# Patient Record
Sex: Female | Born: 1945 | Race: White | Hispanic: No | Smoking: Never smoker
Health system: Southern US, Community
[De-identification: ages and names within clinical notes are randomized; demographics above are authoritative.]

## PROBLEM LIST (undated history)

## (undated) DIAGNOSIS — C50911 Malignant neoplasm of unspecified site of right female breast: Secondary | ICD-10-CM

## (undated) DIAGNOSIS — J302 Other seasonal allergic rhinitis: Secondary | ICD-10-CM

## (undated) DIAGNOSIS — Z98811 Dental restoration status: Secondary | ICD-10-CM

## (undated) DIAGNOSIS — R011 Cardiac murmur, unspecified: Secondary | ICD-10-CM

## (undated) DIAGNOSIS — R079 Chest pain, unspecified: Secondary | ICD-10-CM

## (undated) DIAGNOSIS — I340 Nonrheumatic mitral (valve) insufficiency: Secondary | ICD-10-CM

## (undated) DIAGNOSIS — E785 Hyperlipidemia, unspecified: Secondary | ICD-10-CM

## (undated) DIAGNOSIS — M1990 Unspecified osteoarthritis, unspecified site: Secondary | ICD-10-CM

## (undated) DIAGNOSIS — Z800 Family history of malignant neoplasm of digestive organs: Secondary | ICD-10-CM

## (undated) DIAGNOSIS — I313 Pericardial effusion (noninflammatory): Secondary | ICD-10-CM

## (undated) DIAGNOSIS — M19049 Primary osteoarthritis, unspecified hand: Secondary | ICD-10-CM

## (undated) DIAGNOSIS — G4700 Insomnia, unspecified: Secondary | ICD-10-CM

## (undated) DIAGNOSIS — E2839 Other primary ovarian failure: Secondary | ICD-10-CM

## (undated) DIAGNOSIS — R2231 Localized swelling, mass and lump, right upper limb: Secondary | ICD-10-CM

## (undated) DIAGNOSIS — Z972 Presence of dental prosthetic device (complete) (partial): Secondary | ICD-10-CM

## (undated) DIAGNOSIS — Z86718 Personal history of other venous thrombosis and embolism: Secondary | ICD-10-CM

## (undated) DIAGNOSIS — Z87448 Personal history of other diseases of urinary system: Secondary | ICD-10-CM

## (undated) DIAGNOSIS — E119 Type 2 diabetes mellitus without complications: Secondary | ICD-10-CM

## (undated) DIAGNOSIS — I10 Essential (primary) hypertension: Secondary | ICD-10-CM

## (undated) DIAGNOSIS — E7800 Pure hypercholesterolemia, unspecified: Secondary | ICD-10-CM

## (undated) DIAGNOSIS — I517 Cardiomegaly: Secondary | ICD-10-CM

## (undated) DIAGNOSIS — N200 Calculus of kidney: Secondary | ICD-10-CM

## (undated) HISTORY — DX: Hypercalcemia: E83.52

## (undated) HISTORY — DX: Cardiac murmur, unspecified: R01.1

## (undated) HISTORY — DX: Chest pain, unspecified: R07.9

## (undated) HISTORY — DX: Family history of malignant neoplasm of digestive organs: Z80.0

## (undated) HISTORY — DX: Malignant neoplasm of unspecified site of right female breast: C50.911

## (undated) HISTORY — DX: Other primary ovarian failure: E28.39

## (undated) HISTORY — DX: Nonrheumatic mitral (valve) insufficiency: I34.0

## (undated) HISTORY — DX: Primary osteoarthritis, unspecified hand: M19.049

## (undated) HISTORY — DX: Calculus of kidney: N20.0

## (undated) HISTORY — DX: Insomnia, unspecified: G47.00

## (undated) HISTORY — DX: Type 2 diabetes mellitus without complications: E11.9

## (undated) HISTORY — DX: Cardiomegaly: I51.7

## (undated) HISTORY — DX: Hyperlipidemia, unspecified: E78.5

---

## 1990-11-08 DIAGNOSIS — Z86718 Personal history of other venous thrombosis and embolism: Secondary | ICD-10-CM

## 1990-11-08 HISTORY — PX: ABDOMINAL HYSTERECTOMY: SHX81

## 1990-11-08 HISTORY — DX: Personal history of other venous thrombosis and embolism: Z86.718

## 2000-08-09 ENCOUNTER — Encounter: Payer: Self-pay | Admitting: Hematology and Oncology

## 2000-08-09 ENCOUNTER — Encounter: Admission: RE | Admit: 2000-08-09 | Discharge: 2000-08-09 | Payer: Self-pay | Admitting: Hematology and Oncology

## 2000-11-08 DIAGNOSIS — I3139 Other pericardial effusion (noninflammatory): Secondary | ICD-10-CM

## 2000-11-08 DIAGNOSIS — I313 Pericardial effusion (noninflammatory): Secondary | ICD-10-CM

## 2000-11-08 HISTORY — DX: Pericardial effusion (noninflammatory): I31.3

## 2000-11-08 HISTORY — DX: Other pericardial effusion (noninflammatory): I31.39

## 2000-12-12 ENCOUNTER — Encounter: Admission: RE | Admit: 2000-12-12 | Discharge: 2000-12-12 | Payer: Self-pay | Admitting: Hematology and Oncology

## 2000-12-12 ENCOUNTER — Encounter: Payer: Self-pay | Admitting: Hematology and Oncology

## 2001-02-13 ENCOUNTER — Encounter: Admission: RE | Admit: 2001-02-13 | Discharge: 2001-02-13 | Payer: Self-pay | Admitting: Hematology and Oncology

## 2001-02-13 ENCOUNTER — Encounter: Payer: Self-pay | Admitting: Hematology and Oncology

## 2001-06-16 ENCOUNTER — Ambulatory Visit: Admission: RE | Admit: 2001-06-16 | Discharge: 2001-06-16 | Payer: Self-pay | Admitting: Hematology and Oncology

## 2001-08-15 ENCOUNTER — Encounter: Payer: Self-pay | Admitting: Hematology and Oncology

## 2001-08-15 ENCOUNTER — Encounter: Admission: RE | Admit: 2001-08-15 | Discharge: 2001-08-15 | Payer: Self-pay | Admitting: Hematology and Oncology

## 2001-08-21 ENCOUNTER — Encounter: Payer: Self-pay | Admitting: Thoracic Surgery (Cardiothoracic Vascular Surgery)

## 2001-08-21 ENCOUNTER — Encounter
Admission: RE | Admit: 2001-08-21 | Discharge: 2001-08-21 | Payer: Self-pay | Admitting: Thoracic Surgery (Cardiothoracic Vascular Surgery)

## 2001-08-23 ENCOUNTER — Ambulatory Visit (HOSPITAL_COMMUNITY)
Admission: RE | Admit: 2001-08-23 | Discharge: 2001-08-23 | Payer: Self-pay | Admitting: Thoracic Surgery (Cardiothoracic Vascular Surgery)

## 2001-08-23 HISTORY — PX: TRANSESOPHAGEAL ECHOCARDIOGRAM: SHX273

## 2002-08-17 ENCOUNTER — Encounter: Payer: Self-pay | Admitting: Hematology and Oncology

## 2002-08-17 ENCOUNTER — Encounter: Admission: RE | Admit: 2002-08-17 | Discharge: 2002-08-17 | Payer: Self-pay | Admitting: Hematology and Oncology

## 2002-08-20 ENCOUNTER — Ambulatory Visit (HOSPITAL_COMMUNITY): Admission: RE | Admit: 2002-08-20 | Discharge: 2002-08-20 | Payer: Self-pay | Admitting: Gastroenterology

## 2002-12-29 ENCOUNTER — Encounter: Payer: Self-pay | Admitting: Emergency Medicine

## 2002-12-29 ENCOUNTER — Inpatient Hospital Stay (HOSPITAL_COMMUNITY): Admission: EM | Admit: 2002-12-29 | Discharge: 2003-01-01 | Payer: Self-pay | Admitting: Emergency Medicine

## 2003-08-20 ENCOUNTER — Encounter: Admission: RE | Admit: 2003-08-20 | Discharge: 2003-08-20 | Payer: Self-pay | Admitting: Internal Medicine

## 2003-08-20 ENCOUNTER — Encounter: Payer: Self-pay | Admitting: Internal Medicine

## 2004-03-09 ENCOUNTER — Encounter: Admission: RE | Admit: 2004-03-09 | Discharge: 2004-03-09 | Payer: Self-pay | Admitting: Internal Medicine

## 2004-03-11 ENCOUNTER — Ambulatory Visit (HOSPITAL_COMMUNITY): Admission: RE | Admit: 2004-03-11 | Discharge: 2004-03-11 | Payer: Self-pay | Admitting: Internal Medicine

## 2004-03-13 ENCOUNTER — Encounter: Admission: RE | Admit: 2004-03-13 | Discharge: 2004-03-13 | Payer: Self-pay | Admitting: Internal Medicine

## 2004-03-13 ENCOUNTER — Encounter (INDEPENDENT_AMBULATORY_CARE_PROVIDER_SITE_OTHER): Payer: Self-pay | Admitting: *Deleted

## 2004-03-19 ENCOUNTER — Encounter: Admission: RE | Admit: 2004-03-19 | Discharge: 2004-03-19 | Payer: Self-pay | Admitting: Surgery

## 2004-03-23 ENCOUNTER — Ambulatory Visit (HOSPITAL_COMMUNITY): Admission: RE | Admit: 2004-03-23 | Discharge: 2004-03-23 | Payer: Self-pay | Admitting: Surgery

## 2004-03-23 ENCOUNTER — Encounter (INDEPENDENT_AMBULATORY_CARE_PROVIDER_SITE_OTHER): Payer: Self-pay | Admitting: *Deleted

## 2004-03-23 ENCOUNTER — Ambulatory Visit (HOSPITAL_BASED_OUTPATIENT_CLINIC_OR_DEPARTMENT_OTHER): Admission: RE | Admit: 2004-03-23 | Discharge: 2004-03-23 | Payer: Self-pay | Admitting: Surgery

## 2004-03-23 ENCOUNTER — Encounter: Admission: RE | Admit: 2004-03-23 | Discharge: 2004-03-23 | Payer: Self-pay | Admitting: Surgery

## 2004-04-03 ENCOUNTER — Ambulatory Visit (HOSPITAL_COMMUNITY): Admission: RE | Admit: 2004-04-03 | Discharge: 2004-04-04 | Payer: Self-pay | Admitting: Surgery

## 2004-04-03 ENCOUNTER — Encounter (INDEPENDENT_AMBULATORY_CARE_PROVIDER_SITE_OTHER): Payer: Self-pay | Admitting: *Deleted

## 2004-04-03 HISTORY — PX: MASTECTOMY: SHX3

## 2004-11-08 HISTORY — PX: FOOT NEUROMA SURGERY: SHX646

## 2004-12-17 ENCOUNTER — Ambulatory Visit: Payer: Self-pay | Admitting: Oncology

## 2004-12-22 ENCOUNTER — Encounter: Admission: RE | Admit: 2004-12-22 | Discharge: 2004-12-22 | Payer: Self-pay | Admitting: Oncology

## 2005-06-21 IMAGING — CR DG CHEST 2V
2 series · 2 of 2 positions shown · non-contrast
Comparison: none

CLINICAL DATA: Pre-op respiratory exam prior to surgery for left breast carcinoma. 
 CHEST 
 Two views of the chest are compared to a chest x-ray of 08/21/01.  The lungs are clear.  The heart is mildly enlarged and stable.  No bony abnormality is seen. 
 IMPRESSION
 Stable mild cardiomegaly.  No active lung disease.

[view not recorded (1 of 2)]
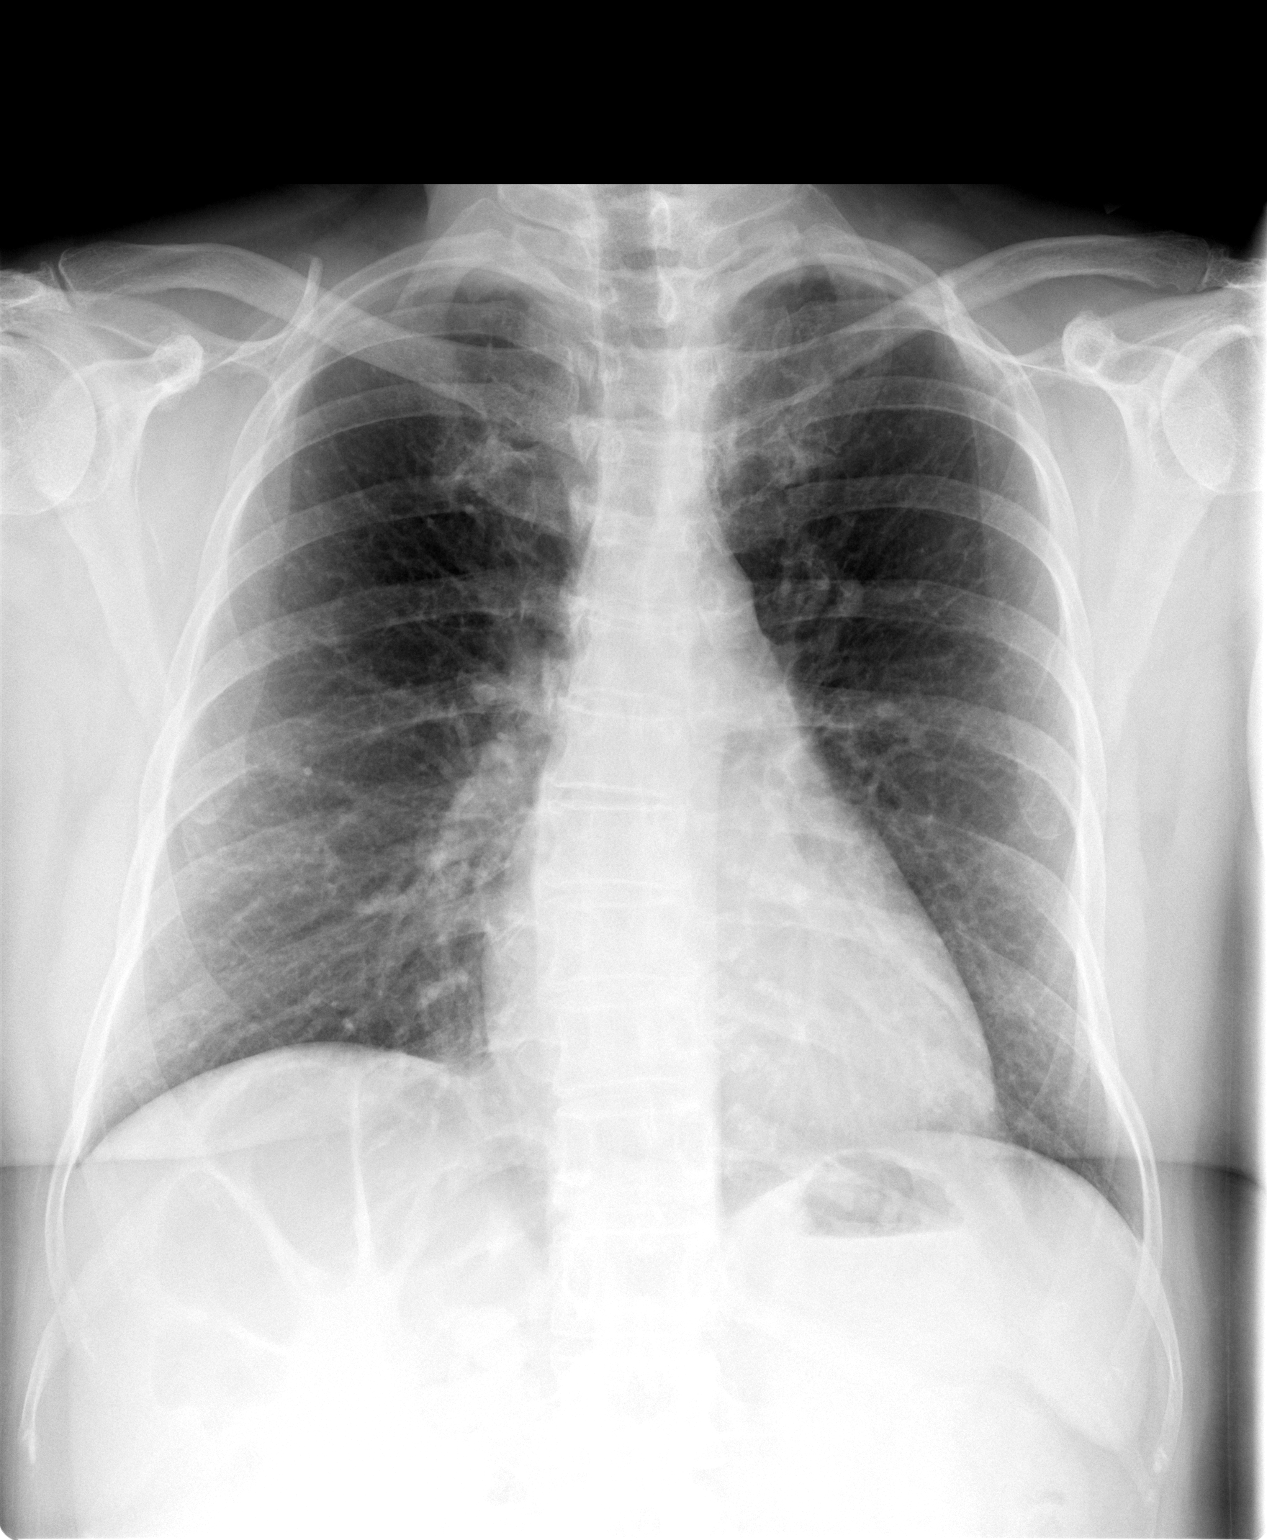

[view not recorded (2 of 2)]
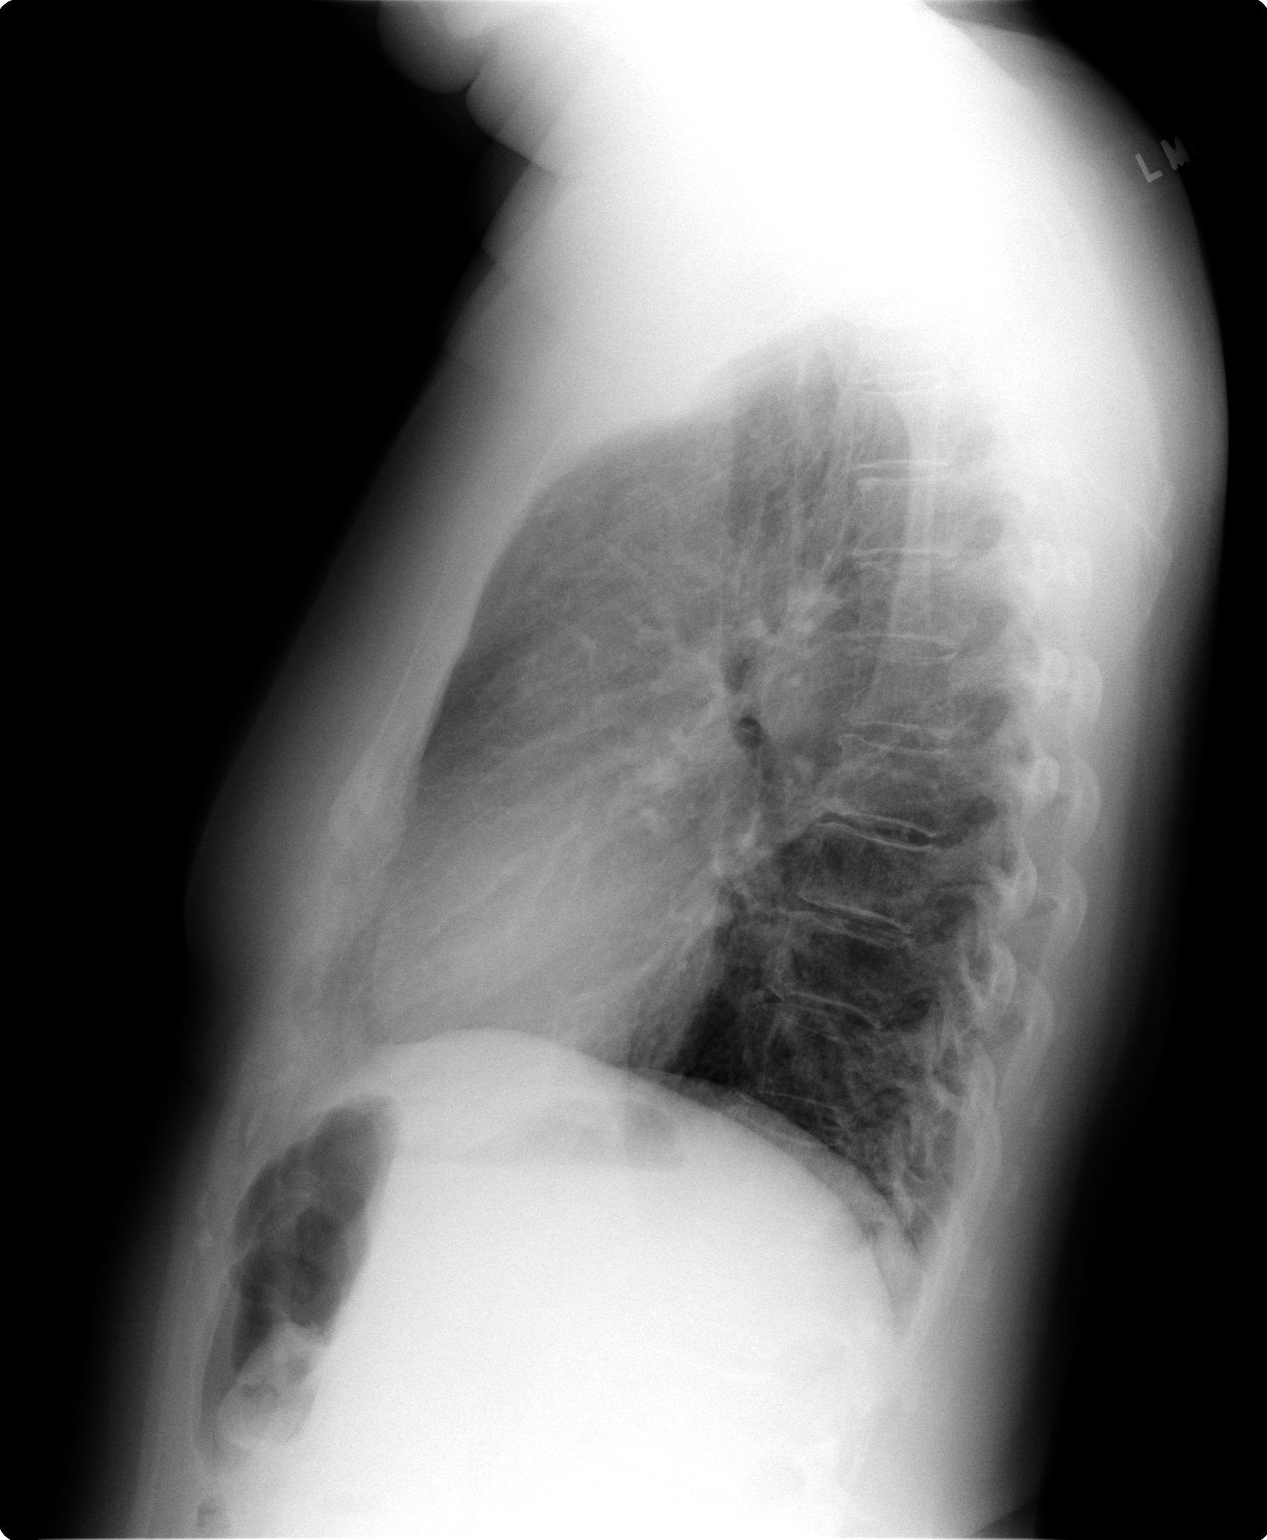

[2 of 2 positions shown; findings below may reference images not displayed]

## 2005-12-23 ENCOUNTER — Encounter: Admission: RE | Admit: 2005-12-23 | Discharge: 2005-12-23 | Payer: Self-pay | Admitting: Internal Medicine

## 2006-04-15 ENCOUNTER — Ambulatory Visit: Payer: Self-pay | Admitting: Oncology

## 2006-04-26 ENCOUNTER — Encounter: Admission: RE | Admit: 2006-04-26 | Discharge: 2006-04-26 | Payer: Self-pay | Admitting: Oncology

## 2006-05-03 ENCOUNTER — Encounter: Admission: RE | Admit: 2006-05-03 | Discharge: 2006-05-03 | Payer: Self-pay | Admitting: Oncology

## 2006-05-04 ENCOUNTER — Encounter: Admission: RE | Admit: 2006-05-04 | Discharge: 2006-05-04 | Payer: Self-pay | Admitting: Oncology

## 2006-05-20 ENCOUNTER — Ambulatory Visit (HOSPITAL_BASED_OUTPATIENT_CLINIC_OR_DEPARTMENT_OTHER): Admission: RE | Admit: 2006-05-20 | Discharge: 2006-05-20 | Payer: Self-pay | Admitting: Surgery

## 2006-05-20 ENCOUNTER — Encounter (INDEPENDENT_AMBULATORY_CARE_PROVIDER_SITE_OTHER): Payer: Self-pay | Admitting: Specialist

## 2006-06-23 ENCOUNTER — Ambulatory Visit: Payer: Self-pay | Admitting: Oncology

## 2006-09-22 ENCOUNTER — Encounter (INDEPENDENT_AMBULATORY_CARE_PROVIDER_SITE_OTHER): Payer: Self-pay | Admitting: *Deleted

## 2006-09-22 ENCOUNTER — Ambulatory Visit (HOSPITAL_BASED_OUTPATIENT_CLINIC_OR_DEPARTMENT_OTHER): Admission: RE | Admit: 2006-09-22 | Discharge: 2006-09-23 | Payer: Self-pay | Admitting: Surgery

## 2006-09-22 HISTORY — PX: TOTAL MASTECTOMY: SHX6129

## 2007-04-13 ENCOUNTER — Ambulatory Visit: Payer: Self-pay | Admitting: Oncology

## 2007-09-11 ENCOUNTER — Encounter: Admission: RE | Admit: 2007-09-11 | Discharge: 2007-09-25 | Payer: Self-pay | Admitting: Internal Medicine

## 2007-12-26 ENCOUNTER — Encounter: Admission: RE | Admit: 2007-12-26 | Discharge: 2008-01-09 | Payer: Self-pay | Admitting: Internal Medicine

## 2010-08-10 ENCOUNTER — Encounter: Admission: RE | Admit: 2010-08-10 | Discharge: 2010-08-10 | Payer: Self-pay | Admitting: Pediatrics

## 2010-11-29 ENCOUNTER — Encounter: Payer: Self-pay | Admitting: Surgery

## 2011-01-25 ENCOUNTER — Other Ambulatory Visit: Payer: Self-pay | Admitting: Family Medicine

## 2011-01-25 DIAGNOSIS — R591 Generalized enlarged lymph nodes: Secondary | ICD-10-CM

## 2011-01-26 ENCOUNTER — Ambulatory Visit
Admission: RE | Admit: 2011-01-26 | Discharge: 2011-01-26 | Disposition: A | Discharge status: Home / Self Care | Payer: Medicare Other | Source: Ambulatory Visit | Attending: Family Medicine | Admitting: Family Medicine

## 2011-01-26 DIAGNOSIS — R591 Generalized enlarged lymph nodes: Secondary | ICD-10-CM

## 2011-03-26 NOTE — Discharge Summary (Signed)
   NAME:  Lisa, Bradshaw                         ACCOUNT NO.:  [id number]   MEDICAL RECORD NO.:  [id number]                   PATIENT TYPE:  INP   LOCATION:  5705                                 FACILITY:  [facility]   PHYSICIAN:  Olene Craven, M.D.            DATE OF BIRTH:  [birth year]   DATE OF ADMISSION:  12/29/2002  DATE OF DISCHARGE:  01/01/2003                                 DISCHARGE SUMMARY   DISCHARGE DIAGNOSES:  1. Cleared abdominal pain.  2. Diverticulitis.  3. Hypertension.  4. Hyperlipidemia.   DISCHARGE MEDICATIONS:  1. Lipitor 40 mg q.h.s.  2. Liqui-flax seeds, 2 tablets p.o. daily.  3. Prinzide 20/12.5 p.o. b.i.d.  4. Cipro 500 mg p.o. b.i.d. x8 days.  5. Flagyl 70 mg p.o. q.i.d. x8 days.   FOLLOW UP:  The patient will follow up with Dr.  Barbee Shropshire in 2 weeks' time.   CONSULTATIONS:  None.   PROCEDURE:  None.   HISTORY OF PRESENT ILLNESS:  The patient was admitted on 12/29/02 after  experiencing nausea and vomiting and abdominal discomfort. She was noted to  have left lower quadrant discomfort.  CT scan of the pelvis indicated  diverticulitis. She was started on IV antibiotics.  She has had a slow  improvement of her white count and discomfort, she was able to tolerate full  diet by the time of discharge. Her white count at the date of admission was  17.9.  At the time of discharge, it had normalized to 8.4.   The patient was discharged in stable condition to finish out a full course  of antibiotics and follow up as an outpatient.   CONDITION ON DISCHARGE:  Stable.                                               Olene Craven, M.D.    DEH/MEDQ  D:  02/25/2003  T:  02/25/2003  Job:  [id number]

## 2011-03-26 NOTE — Op Note (Signed)
Lisa Bradshaw, CRAFTS NO.:  [id number]   MEDICAL RECORD NO.:  [id number]          PATIENT TYPE:  AMB   LOCATION:  DSC                          FACILITY:  [facility]   PHYSICIAN:  Currie Paris, M.D.DATE OF BIRTH:  [birth year]   DATE OF PROCEDURE:  09/22/2006  DATE OF DISCHARGE:                                 OPERATIVE REPORT   OFFICE MEDICAL RECORD NUMBER:  [id number].   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   OPERATION:  Right total mastectomy with blue dye injection and axillary  sentinel lymph node biopsy.   SURGEON:  Dr. Jamey Ripa.   ASSISTANT:  Dr. Lurene Shadow.   ANESTHESIA:  General.   CLINICAL HISTORY:  This is a 60-year lady who had a right breast biopsy on  July 13 which showed atypical hyperplasia.  She is status post left  mastectomy for breast cancer.  She has a history of DVT and is not a  candidate for anti-estrogen therapy.  She elected therefore to have a right  prophylactic mastectomy with sentinel node biopsy.   DESCRIPTION OF PROCEDURE:  The patient was seen in the holding area, and she  had no further questions.  The operative site was noted to be the right  side, but since she has already had a left mastectomy, no marks made.   The patient was then taken to the operating room, and after satisfactory  general anesthesia had been obtained, left breast was prepped and a time-out  occurred.   I injected 5 mL of dilute methylene blue circumareolarly and massaged it in.  The entire breast was then prepped with Betadine and a sterile field  established.   An elliptical incision was made.  Neoprobe identified a hot area in the  axilla, and the overlying skin was marked.   Skin flap was raised superiorly to the clavicle, medically to the sternum  and laterally into the axilla.  The axillary fat was entered, and a  three  little blue lymphatics were found leading to a single large blue lymph node  which was noted to be soft.  This was excised and had counts of about 1000.   With this removed, no other palpable adenopathy was noted, no other blue  lymph nodes were noted and there were no hot areas in the axilla.   Attention was turned to the inferior flap was then made going to the  inframammary fold and laterally out the latissimus.  The breast was removed  from medial to lateral, leaving the axillary contents in situ.  This was all  done with cautery.  Bleeders in the axilla had been clipped as well as some  lymphatics.   Once this was out, I placed two 19 Blake drains.  They were secured with 2-0  nylons.   The would was irrigated, and a final check for hemostasis was made.  Once  everything appeared to be dry, the incision was closed with staples.  I left  about 30 mL of 0.25% plain Marcaine under the flaps for about 10 minutes and  then charged the drains.   Pathology reported that the sentinel node was negative.   The patient tolerated the procedure well.  There were no operative  complications.  All counts were correct.      Currie Paris, M.D.  Electronically Signed     CJS/MEDQ  D:  09/22/2006  T:  09/22/2006  Job:  [id number]   cc:   Valentino Hue. Magrinat, M.D.  Olene Craven, M.D.

## 2011-03-26 NOTE — Procedures (Signed)
[place]  Bradshaw:    Lisa Bradshaw, Lisa Bradshaw Visit Number: [id number] MRN: [id number]          Service Type: DSU Location: [facility] [id number] [id number] Attending Physician:  Tressie Stalker Dictated by:   Salvatore Decent. Cornelius Moras, M.D. Proc. Date: 08/23/01 Admit Date:  08/23/2001   CC:         [name], M.D.  Lowell C. Catha Gosselin, M.D.  Kern Reap, M.D.  Burna Forts, M.D.   Procedure Report  PREOPERATIVE DIAGNOSIS:  Pericardial effusion.  POSTOPERATIVE DIAGNOSIS:  Very small pericardial effusion.  PROCEDURE:  Transesophageal echocardiogram.  SURGEON:  Burna Forts, M.D., with consultation by Salvatore Decent. Cornelius Moras, M.D.  ANESTHESIA:  General.  BRIEF CLINICAL NOTE AND INDICATION FOR PROCEDURE:  Lisa Bradshaw is a 65 year old white female followed by Kern Reap, M.D., and Miquel Dunn. Catha Gosselin, M.D., and referred by [name], M.D., for evaluation of pericardial effusion.  A full consultation note has been dictated previously, but this Bradshaw presents with atypical symptoms of shortness of breath and chest discomfort and left back pain.  She had a pericardial effusion identified at Lisa time of abdominal CT scan performed several months ago. this led to a transthoracic echocardiogram performed initially at [facility] and a repeat echocardiogram at [facility] in September 2002.  These were felt to represent an enlarging, moderate-sized pericardial effusion.  Lisa Bradshaw was referred for possible subxiphoid pericardial window.  Options have been discussed at length with Lisa Bradshaw and because of concerns about Lisa possibility of metastatic carcinoma of Lisa breast, Lisa Bradshaw desires to proceed with surgical intervention for definitive diagnosis.  I had Lisa opportunity to review Lisa patients transthoracic echocardiogram performed in September of this year on October 15.  This examination indeed identified Lisa presence of  a small to moderate-sized pericardial effusion, which appeared to be free-flowing. Because of Lisa relatively small size of Lisa effusion and Lisa complete lack of any suggestion of secondary effects on cardiac function, a follow-up echocardiogram is felt to be indicated prior to proceeding directly with pericardial window.  This is discussed with Lisa Bradshaw on Lisa morning of surgery, and she understands Lisa plan to proceed with transesophageal echocardiogram initially to verify Lisa presence of significant pericardial effusion, with Lisa understanding that in Lisa absence of significant effusion, surgery will not be performed.  DESCRIPTION OF PROCEDURE:  Lisa Bradshaw is brought to Lisa operating room on Lisa above-mentioned date and placed in Lisa supine position on Lisa operating table. General endotracheal anesthesia is induced uneventfully under Lisa care and direction of Dr. Burna Forts.  Transesophageal echocardiogram is subsequently performed by Dr. Jacklynn Bue, and a separate procedure note will be dictated by Dr. Jacklynn Bue to document Lisa pertinent findings of Lisa procedure. However, I have reviewed Lisa real-time images of Lisa echocardiogram at length with Dr. Jacklynn Bue, and I agree with his findings that there remains only a very small pericardial effusion, which is only really visible posteriorly.  It is my opinion that this, in fact, appears considerably smaller than Lisa appearance of Lisa moderate-sized pericardial effusion seen on Lisa transthoracic echocardiogram performed in September.  Because of Lisa very small size of this pericardial effusion, I do not feel that pericardial window is indicated.  This is also discussed over Lisa telephone with Dr. Jeanella Cara. He agrees with Lisa plan to hold off on any surgical procedure for pericardial drainage at this point in time.  Lisa Bradshaw tolerated Lisa procedure  well and was extubated in Lisa operating room and transported to Lisa recovery room  in stable condition.  There are no complications.  Lisa Bradshaw and her family are advised of Lisa findings at transesophageal echocardiogram.  I recommend follow-up transthoracic echocardiogram in one to two months. Dictated by:   Salvatore Decent Cornelius Moras, M.D. Attending Physician:  Tressie Stalker DD:  08/23/01 TD:  08/23/01 Job: [id number]

## 2012-02-01 DIAGNOSIS — E782 Mixed hyperlipidemia: Secondary | ICD-10-CM | POA: Diagnosis not present

## 2012-02-01 DIAGNOSIS — E119 Type 2 diabetes mellitus without complications: Secondary | ICD-10-CM | POA: Diagnosis not present

## 2012-02-01 DIAGNOSIS — C50919 Malignant neoplasm of unspecified site of unspecified female breast: Secondary | ICD-10-CM | POA: Diagnosis not present

## 2012-02-01 DIAGNOSIS — I10 Essential (primary) hypertension: Secondary | ICD-10-CM | POA: Diagnosis not present

## 2012-08-01 DIAGNOSIS — I10 Essential (primary) hypertension: Secondary | ICD-10-CM | POA: Diagnosis not present

## 2012-08-01 DIAGNOSIS — E782 Mixed hyperlipidemia: Secondary | ICD-10-CM | POA: Diagnosis not present

## 2012-08-01 DIAGNOSIS — G4700 Insomnia, unspecified: Secondary | ICD-10-CM | POA: Diagnosis not present

## 2012-08-01 DIAGNOSIS — E119 Type 2 diabetes mellitus without complications: Secondary | ICD-10-CM | POA: Diagnosis not present

## 2012-08-01 DIAGNOSIS — Z23 Encounter for immunization: Secondary | ICD-10-CM | POA: Diagnosis not present

## 2012-09-12 DIAGNOSIS — J329 Chronic sinusitis, unspecified: Secondary | ICD-10-CM | POA: Diagnosis not present

## 2012-09-18 DIAGNOSIS — H2510 Age-related nuclear cataract, unspecified eye: Secondary | ICD-10-CM | POA: Diagnosis not present

## 2012-09-18 DIAGNOSIS — H5200 Hypermetropia, unspecified eye: Secondary | ICD-10-CM | POA: Diagnosis not present

## 2012-09-18 DIAGNOSIS — E119 Type 2 diabetes mellitus without complications: Secondary | ICD-10-CM | POA: Diagnosis not present

## 2012-09-18 DIAGNOSIS — H52229 Regular astigmatism, unspecified eye: Secondary | ICD-10-CM | POA: Diagnosis not present

## 2012-10-17 DIAGNOSIS — I319 Disease of pericardium, unspecified: Secondary | ICD-10-CM | POA: Diagnosis not present

## 2013-01-31 DIAGNOSIS — E119 Type 2 diabetes mellitus without complications: Secondary | ICD-10-CM | POA: Diagnosis not present

## 2013-01-31 DIAGNOSIS — Z0000 Encounter for general adult medical examination without abnormal findings: Secondary | ICD-10-CM | POA: Diagnosis not present

## 2013-01-31 DIAGNOSIS — I10 Essential (primary) hypertension: Secondary | ICD-10-CM | POA: Diagnosis not present

## 2013-01-31 DIAGNOSIS — E782 Mixed hyperlipidemia: Secondary | ICD-10-CM | POA: Diagnosis not present

## 2013-03-28 DIAGNOSIS — E8941 Symptomatic postprocedural ovarian failure: Secondary | ICD-10-CM | POA: Diagnosis not present

## 2013-05-15 DIAGNOSIS — M25569 Pain in unspecified knee: Secondary | ICD-10-CM | POA: Diagnosis not present

## 2013-08-06 DIAGNOSIS — Z86010 Personal history of colonic polyps: Secondary | ICD-10-CM | POA: Diagnosis not present

## 2013-08-06 DIAGNOSIS — M25569 Pain in unspecified knee: Secondary | ICD-10-CM | POA: Diagnosis not present

## 2013-08-06 DIAGNOSIS — E119 Type 2 diabetes mellitus without complications: Secondary | ICD-10-CM | POA: Diagnosis not present

## 2013-08-06 DIAGNOSIS — Z23 Encounter for immunization: Secondary | ICD-10-CM | POA: Diagnosis not present

## 2013-08-06 DIAGNOSIS — I10 Essential (primary) hypertension: Secondary | ICD-10-CM | POA: Diagnosis not present

## 2013-08-06 DIAGNOSIS — E782 Mixed hyperlipidemia: Secondary | ICD-10-CM | POA: Diagnosis not present

## 2013-09-20 DIAGNOSIS — Z1211 Encounter for screening for malignant neoplasm of colon: Secondary | ICD-10-CM | POA: Diagnosis not present

## 2013-09-20 DIAGNOSIS — Z800 Family history of malignant neoplasm of digestive organs: Secondary | ICD-10-CM | POA: Diagnosis not present

## 2013-09-21 DIAGNOSIS — M25569 Pain in unspecified knee: Secondary | ICD-10-CM | POA: Diagnosis not present

## 2013-09-24 DIAGNOSIS — H2510 Age-related nuclear cataract, unspecified eye: Secondary | ICD-10-CM | POA: Diagnosis not present

## 2013-09-24 DIAGNOSIS — E119 Type 2 diabetes mellitus without complications: Secondary | ICD-10-CM | POA: Diagnosis not present

## 2013-09-24 DIAGNOSIS — H5200 Hypermetropia, unspecified eye: Secondary | ICD-10-CM | POA: Diagnosis not present

## 2013-09-24 DIAGNOSIS — H31009 Unspecified chorioretinal scars, unspecified eye: Secondary | ICD-10-CM | POA: Diagnosis not present

## 2014-02-06 DIAGNOSIS — R2231 Localized swelling, mass and lump, right upper limb: Secondary | ICD-10-CM

## 2014-02-06 HISTORY — DX: Localized swelling, mass and lump, right upper limb: R22.31

## 2014-02-12 DIAGNOSIS — Z23 Encounter for immunization: Secondary | ICD-10-CM | POA: Diagnosis not present

## 2014-02-12 DIAGNOSIS — Z1331 Encounter for screening for depression: Secondary | ICD-10-CM | POA: Diagnosis not present

## 2014-02-12 DIAGNOSIS — E782 Mixed hyperlipidemia: Secondary | ICD-10-CM | POA: Diagnosis not present

## 2014-02-12 DIAGNOSIS — E119 Type 2 diabetes mellitus without complications: Secondary | ICD-10-CM | POA: Diagnosis not present

## 2014-02-12 DIAGNOSIS — Z0000 Encounter for general adult medical examination without abnormal findings: Secondary | ICD-10-CM | POA: Diagnosis not present

## 2014-02-12 DIAGNOSIS — L989 Disorder of the skin and subcutaneous tissue, unspecified: Secondary | ICD-10-CM | POA: Diagnosis not present

## 2014-02-12 DIAGNOSIS — I10 Essential (primary) hypertension: Secondary | ICD-10-CM | POA: Diagnosis not present

## 2014-02-18 DIAGNOSIS — L723 Sebaceous cyst: Secondary | ICD-10-CM | POA: Diagnosis not present

## 2014-02-19 ENCOUNTER — Encounter (HOSPITAL_BASED_OUTPATIENT_CLINIC_OR_DEPARTMENT_OTHER): Payer: Self-pay | Admitting: *Deleted

## 2014-02-19 NOTE — Pre-Procedure Instructions (Signed)
To come for BMET and EKG 

## 2014-02-20 ENCOUNTER — Other Ambulatory Visit: Payer: Self-pay

## 2014-02-20 ENCOUNTER — Ambulatory Visit (HOSPITAL_BASED_OUTPATIENT_CLINIC_OR_DEPARTMENT_OTHER)
Admission: RE | Admit: 2014-02-20 | Discharge: 2014-02-20 | Disposition: A | Discharge status: Home / Self Care | Payer: Medicare Other | Source: Ambulatory Visit | Attending: Orthopedic Surgery | Admitting: Orthopedic Surgery

## 2014-02-20 DIAGNOSIS — Z01810 Encounter for preprocedural cardiovascular examination: Secondary | ICD-10-CM | POA: Diagnosis not present

## 2014-02-20 DIAGNOSIS — Z01812 Encounter for preprocedural laboratory examination: Secondary | ICD-10-CM | POA: Diagnosis not present

## 2014-02-20 LAB — BASIC METABOLIC PANEL
BUN: 14 mg/dL (ref 6–23)
CO2: 27 mEq/L (ref 19–32)
Calcium: 10 mg/dL (ref 8.4–10.5)
Chloride: 105 mEq/L (ref 96–112)
Creatinine, Ser: 0.75 mg/dL (ref 0.50–1.10)
GFR calc Af Amer: >90 mL/min (ref >90)
GFR, EST NON AFRICAN AMERICAN: 84 mL/min — AB (ref >90)
GLUCOSE: 108 mg/dL — AB (ref 70–99)
Potassium: 4.7 mEq/L (ref 3.7–5.3)
Sodium: 142 mEq/L (ref 137–147)

## 2014-02-22 ENCOUNTER — Other Ambulatory Visit: Payer: Self-pay | Admitting: Orthopedic Surgery

## 2014-02-25 ENCOUNTER — Encounter (HOSPITAL_BASED_OUTPATIENT_CLINIC_OR_DEPARTMENT_OTHER): Payer: Medicare Other | Admitting: Anesthesiology

## 2014-02-25 ENCOUNTER — Encounter (HOSPITAL_BASED_OUTPATIENT_CLINIC_OR_DEPARTMENT_OTHER): Payer: Self-pay

## 2014-02-25 ENCOUNTER — Encounter (HOSPITAL_BASED_OUTPATIENT_CLINIC_OR_DEPARTMENT_OTHER)
Admission: RE | Disposition: A | Discharge status: Home / Self Care | Payer: Self-pay | Source: Ambulatory Visit | Attending: Orthopedic Surgery

## 2014-02-25 ENCOUNTER — Ambulatory Visit (HOSPITAL_BASED_OUTPATIENT_CLINIC_OR_DEPARTMENT_OTHER)
Admission: RE | Admit: 2014-02-25 | Discharge: 2014-02-25 | Disposition: A | Discharge status: Home / Self Care | Payer: Medicare Other | Source: Ambulatory Visit | Attending: Orthopedic Surgery | Admitting: Orthopedic Surgery

## 2014-02-25 ENCOUNTER — Ambulatory Visit (HOSPITAL_BASED_OUTPATIENT_CLINIC_OR_DEPARTMENT_OTHER): Payer: Medicare Other | Admitting: Anesthesiology

## 2014-02-25 DIAGNOSIS — J301 Allergic rhinitis due to pollen: Secondary | ICD-10-CM | POA: Diagnosis not present

## 2014-02-25 DIAGNOSIS — Z86718 Personal history of other venous thrombosis and embolism: Secondary | ICD-10-CM | POA: Insufficient documentation

## 2014-02-25 DIAGNOSIS — I10 Essential (primary) hypertension: Secondary | ICD-10-CM | POA: Diagnosis not present

## 2014-02-25 DIAGNOSIS — B079 Viral wart, unspecified: Secondary | ICD-10-CM | POA: Insufficient documentation

## 2014-02-25 DIAGNOSIS — E7800 Pure hypercholesterolemia, unspecified: Secondary | ICD-10-CM | POA: Insufficient documentation

## 2014-02-25 DIAGNOSIS — Z7982 Long term (current) use of aspirin: Secondary | ICD-10-CM | POA: Diagnosis not present

## 2014-02-25 DIAGNOSIS — D492 Neoplasm of unspecified behavior of bone, soft tissue, and skin: Secondary | ICD-10-CM | POA: Diagnosis not present

## 2014-02-25 DIAGNOSIS — M129 Arthropathy, unspecified: Secondary | ICD-10-CM | POA: Diagnosis not present

## 2014-02-25 DIAGNOSIS — Q619 Cystic kidney disease, unspecified: Secondary | ICD-10-CM | POA: Insufficient documentation

## 2014-02-25 DIAGNOSIS — Z9010 Acquired absence of unspecified breast and nipple: Secondary | ICD-10-CM | POA: Insufficient documentation

## 2014-02-25 DIAGNOSIS — L989 Disorder of the skin and subcutaneous tissue, unspecified: Secondary | ICD-10-CM | POA: Diagnosis not present

## 2014-02-25 DIAGNOSIS — L988 Other specified disorders of the skin and subcutaneous tissue: Secondary | ICD-10-CM | POA: Diagnosis not present

## 2014-02-25 DIAGNOSIS — E119 Type 2 diabetes mellitus without complications: Secondary | ICD-10-CM | POA: Diagnosis not present

## 2014-02-25 DIAGNOSIS — D485 Neoplasm of uncertain behavior of skin: Secondary | ICD-10-CM | POA: Diagnosis not present

## 2014-02-25 HISTORY — DX: Other seasonal allergic rhinitis: J30.2

## 2014-02-25 HISTORY — DX: Pericardial effusion (noninflammatory): I31.3

## 2014-02-25 HISTORY — DX: Type 2 diabetes mellitus without complications: E11.9

## 2014-02-25 HISTORY — DX: Dental restoration status: Z98.811

## 2014-02-25 HISTORY — PX: EXCISION METACARPAL MASS: SHX6372

## 2014-02-25 HISTORY — DX: Presence of dental prosthetic device (complete) (partial): Z97.2

## 2014-02-25 HISTORY — DX: Pure hypercholesterolemia, unspecified: E78.00

## 2014-02-25 HISTORY — DX: Personal history of other diseases of urinary system: Z87.448

## 2014-02-25 HISTORY — DX: Essential (primary) hypertension: I10

## 2014-02-25 HISTORY — DX: Localized swelling, mass and lump, right upper limb: R22.31

## 2014-02-25 HISTORY — DX: Personal history of other venous thrombosis and embolism: Z86.718

## 2014-02-25 HISTORY — DX: Unspecified osteoarthritis, unspecified site: M19.90

## 2014-02-25 LAB — GLUCOSE, CAPILLARY
Glucose-Capillary: 110 mg/dL — ABNORMAL HIGH (ref 70–99)
Glucose-Capillary: 99 mg/dL (ref 70–99)

## 2014-02-25 LAB — POCT HEMOGLOBIN-HEMACUE: HEMOGLOBIN: 14 g/dL (ref 12.0–15.0)

## 2014-02-25 SURGERY — EXCISION METACARPAL MASS
Anesthesia: General | Site: Thumb | Laterality: Right

## 2014-02-25 MED ORDER — MIDAZOLAM HCL 2 MG/2ML IJ SOLN
INTRAMUSCULAR | Status: AC
  Filled 2014-02-25: qty 2

## 2014-02-25 MED ORDER — FENTANYL CITRATE 0.05 MG/ML IJ SOLN
INTRAMUSCULAR | Status: DC | PRN
  Administered 2014-02-25 (×2): 50 ug via INTRAVENOUS

## 2014-02-25 MED ORDER — BUPIVACAINE HCL (PF) 0.25 % IJ SOLN
INTRAMUSCULAR | Status: AC
  Filled 2014-02-25: qty 30

## 2014-02-25 MED ORDER — LIDOCAINE HCL (CARDIAC) 20 MG/ML IV SOLN
INTRAVENOUS | Status: DC | PRN
  Administered 2014-02-25: 100 mg via INTRAVENOUS

## 2014-02-25 MED ORDER — MIDAZOLAM HCL 2 MG/2ML IJ SOLN: 1.0000 mg | INTRAMUSCULAR | Status: DC | PRN

## 2014-02-25 MED ORDER — CHLORHEXIDINE GLUCONATE 4 % EX LIQD: 60.0000 mL | Freq: Once | CUTANEOUS | Status: DC

## 2014-02-25 MED ORDER — MIDAZOLAM HCL 5 MG/5ML IJ SOLN
INTRAMUSCULAR | Status: DC | PRN
  Administered 2014-02-25: 2 mg via INTRAVENOUS

## 2014-02-25 MED ORDER — FENTANYL CITRATE 0.05 MG/ML IJ SOLN: 50.0000 ug | Freq: Once | INTRAMUSCULAR | Status: DC

## 2014-02-25 MED ORDER — PROPOFOL 10 MG/ML IV BOLUS
INTRAVENOUS | Status: DC | PRN
  Administered 2014-02-25: 150 mg via INTRAVENOUS

## 2014-02-25 MED ORDER — HYDROCODONE-ACETAMINOPHEN 5-325 MG PO TABS: ORAL_TABLET | ORAL | Status: DC

## 2014-02-25 MED ORDER — ONDANSETRON HCL 4 MG/2ML IJ SOLN
INTRAMUSCULAR | Status: DC | PRN
  Administered 2014-02-25: 4 mg via INTRAVENOUS

## 2014-02-25 MED ORDER — CEFAZOLIN SODIUM-DEXTROSE 2-3 GM-% IV SOLR
2.0000 g | INTRAVENOUS | Status: AC
  Administered 2014-02-25: 2 g via INTRAVENOUS

## 2014-02-25 MED ORDER — MIDAZOLAM HCL 2 MG/ML PO SYRP: 12.0000 mg | ORAL_SOLUTION | Freq: Once | ORAL | Status: DC | PRN

## 2014-02-25 MED ORDER — BUPIVACAINE HCL (PF) 0.25 % IJ SOLN
INTRAMUSCULAR | Status: DC | PRN
  Administered 2014-02-25: 10 mL

## 2014-02-25 MED ORDER — HYDROMORPHONE HCL PF 1 MG/ML IJ SOLN: 0.2500 mg | INTRAMUSCULAR | Status: DC | PRN

## 2014-02-25 MED ORDER — PROPOFOL 10 MG/ML IV EMUL
INTRAVENOUS | Status: AC
  Filled 2014-02-25: qty 50

## 2014-02-25 MED ORDER — OXYCODONE HCL 5 MG PO TABS: 5.0000 mg | ORAL_TABLET | Freq: Once | ORAL | Status: DC | PRN

## 2014-02-25 MED ORDER — FENTANYL CITRATE 0.05 MG/ML IJ SOLN
INTRAMUSCULAR | Status: AC
  Filled 2014-02-25: qty 2

## 2014-02-25 MED ORDER — FENTANYL CITRATE 0.05 MG/ML IJ SOLN: 50.0000 ug | INTRAMUSCULAR | Status: DC | PRN

## 2014-02-25 MED ORDER — OXYCODONE HCL 5 MG/5ML PO SOLN: 5.0000 mg | Freq: Once | ORAL | Status: DC | PRN

## 2014-02-25 MED ORDER — LACTATED RINGERS IV SOLN
INTRAVENOUS | Status: DC
  Administered 2014-02-25: 08:00:00 via INTRAVENOUS

## 2014-02-25 MED ORDER — CEFAZOLIN SODIUM-DEXTROSE 2-3 GM-% IV SOLR
INTRAVENOUS | Status: AC
  Filled 2014-02-25: qty 50

## 2014-02-25 SURGICAL SUPPLY — 51 items
APL SKNCLS STERI-STRIP NONHPOA (GAUZE/BANDAGES/DRESSINGS)
BANDAGE COBAN STERILE 2 (GAUZE/BANDAGES/DRESSINGS) IMPLANT
BANDAGE ELASTIC 3 VELCRO ST LF (GAUZE/BANDAGES/DRESSINGS) IMPLANT
BANDAGE GAUZE STRT 1 STR LF (GAUZE/BANDAGES/DRESSINGS) IMPLANT
BENZOIN TINCTURE PRP APPL 2/3 (GAUZE/BANDAGES/DRESSINGS) IMPLANT
BLADE MINI RND TIP GREEN BEAV (BLADE) IMPLANT
BLADE SURG 15 STRL LF DISP TIS (BLADE) ×2 IMPLANT
BLADE SURG 15 STRL SS (BLADE) ×6
BNDG CMPR 9X4 STRL LF SNTH (GAUZE/BANDAGES/DRESSINGS) ×1
BNDG CMPR MD 5X2 ELC HKLP STRL (GAUZE/BANDAGES/DRESSINGS)
BNDG COHESIVE 1X5 TAN STRL LF (GAUZE/BANDAGES/DRESSINGS) ×2 IMPLANT
BNDG CONFORM 2 STRL LF (GAUZE/BANDAGES/DRESSINGS) IMPLANT
BNDG ELASTIC 2 VLCR STRL LF (GAUZE/BANDAGES/DRESSINGS) IMPLANT
BNDG ESMARK 4X9 LF (GAUZE/BANDAGES/DRESSINGS) ×2 IMPLANT
BNDG GAUZE ELAST 4 BULKY (GAUZE/BANDAGES/DRESSINGS) IMPLANT
BNDG PLASTER X FAST 3X3 WHT LF (CAST SUPPLIES) IMPLANT
BNDG PLSTR 9X3 FST ST WHT (CAST SUPPLIES)
CHLORAPREP W/TINT 26ML (MISCELLANEOUS) ×3 IMPLANT
CLOSURE WOUND 1/2 X4 (GAUZE/BANDAGES/DRESSINGS)
CORDS BIPOLAR (ELECTRODE) ×3 IMPLANT
COVER MAYO STAND STRL (DRAPES) ×3 IMPLANT
COVER TABLE BACK 60X90 (DRAPES) ×3 IMPLANT
CUFF TOURNIQUET SINGLE 18IN (TOURNIQUET CUFF) ×3 IMPLANT
DRAPE EXTREMITY T 121X128X90 (DRAPE) ×3 IMPLANT
DRAPE SURG 17X23 STRL (DRAPES) ×3 IMPLANT
GAUZE XEROFORM 1X8 LF (GAUZE/BANDAGES/DRESSINGS) ×3 IMPLANT
GLOVE BIO SURGEON STRL SZ7.5 (GLOVE) ×3 IMPLANT
GLOVE BIOGEL PI IND STRL 8 (GLOVE) ×1 IMPLANT
GLOVE BIOGEL PI INDICATOR 8 (GLOVE) ×2
GOWN STRL REUS W/ TWL LRG LVL3 (GOWN DISPOSABLE) ×1 IMPLANT
GOWN STRL REUS W/TWL LRG LVL3 (GOWN DISPOSABLE) ×3
GOWN STRL REUS W/TWL XL LVL3 (GOWN DISPOSABLE) ×3 IMPLANT
NDL HYPO 25X1 1.5 SAFETY (NEEDLE) ×1 IMPLANT
NEEDLE HYPO 25X1 1.5 SAFETY (NEEDLE) ×3 IMPLANT
NS IRRIG 1000ML POUR BTL (IV SOLUTION) ×3 IMPLANT
PACK BASIN DAY SURGERY FS (CUSTOM PROCEDURE TRAY) ×3 IMPLANT
PAD CAST 3X4 CTTN HI CHSV (CAST SUPPLIES) IMPLANT
PAD CAST 4YDX4 CTTN HI CHSV (CAST SUPPLIES) IMPLANT
PADDING CAST ABS 4INX4YD NS (CAST SUPPLIES)
PADDING CAST ABS COTTON 4X4 ST (CAST SUPPLIES) ×1 IMPLANT
PADDING CAST COTTON 3X4 STRL (CAST SUPPLIES)
PADDING CAST COTTON 4X4 STRL (CAST SUPPLIES)
SPONGE GAUZE 4X4 12PLY (GAUZE/BANDAGES/DRESSINGS) ×3 IMPLANT
STOCKINETTE 4X48 STRL (DRAPES) ×3 IMPLANT
STRIP CLOSURE SKIN 1/2X4 (GAUZE/BANDAGES/DRESSINGS) IMPLANT
SUT ETHILON 3 0 PS 1 (SUTURE) IMPLANT
SUT ETHILON 4 0 PS 2 18 (SUTURE) ×3 IMPLANT
SYR BULB 3OZ (MISCELLANEOUS) ×3 IMPLANT
SYR CONTROL 10ML LL (SYRINGE) ×3 IMPLANT
TOWEL OR 17X24 6PK STRL BLUE (TOWEL DISPOSABLE) ×6 IMPLANT
UNDERPAD 30X30 INCONTINENT (UNDERPADS AND DIAPERS) ×3 IMPLANT

## 2014-02-25 NOTE — Anesthesia Preprocedure Evaluation (Signed)
Anesthesia Evaluation  Patient identified by MRN, date of birth, ID band Patient awake    Reviewed: Allergy & Precautions, H&P , NPO status , Patient's Chart, lab work & pertinent test results  Airway Mallampati: I TM Distance: >3 FB Neck ROM: Full    Dental   Pulmonary  breath sounds clear to auscultation        Cardiovascular hypertension, Rhythm:Regular Rate:Normal     Neuro/Psych    GI/Hepatic   Endo/Other  diabetes  Renal/GU      Musculoskeletal   Abdominal   Peds  Hematology   Anesthesia Other Findings   Reproductive/Obstetrics                           Anesthesia Physical Anesthesia Plan  ASA: II  Anesthesia Plan: General   Post-op Pain Management:    Induction: Intravenous  Airway Management Planned: LMA  Additional Equipment:   Intra-op Plan:   Post-operative Plan: Extubation in OR  Informed Consent: I have reviewed the patients History and Physical, chart, labs and discussed the procedure including the risks, benefits and alternatives for the proposed anesthesia with the patient or authorized representative who has indicated his/her understanding and acceptance.     Plan Discussed with: CRNA and Surgeon  Anesthesia Plan Comments:         Anesthesia Quick Evaluation

## 2014-02-25 NOTE — H&P (Signed)
Lisa Bradshaw is an 68 y.o. female.   Chief Complaint: right thumb mass HPI: 68 yo rhd female with mass right thumb x 3 months.  It is bothersome to her.  No noted injury.  No previous masses.  She wishes to have the mass removed.    Past Medical History  Diagnosis Date  . Arthritis   . Mass of finger of right hand 02/2014    thumb  . Non-insulin dependent type 2 diabetes mellitus   . Hypertension     under control with med., has been on med. > 20 yr.  . High cholesterol   . Seasonal allergies   . Dental crowns present   . Dental bridge present     upper and lower  . 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 History of DVT (deep vein thrombosis) 1992  . Pericardial effusion (noninflammatory) 2002    history of  - no longer sees cardiologist    Past Surgical History  Procedure Laterality Date  . Cesarean section  1969  . Abdominal hysterectomy  1992    complete  . 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 node bx.  . Transesophageal echocardiogram  08/23/2001  . Mastectomy Left 04/03/2004  . Foot neuroma surgery Left 2006    History reviewed. No pertinent family history. Social History:  reports that she has never smoked. She has never used smokeless tobacco. She reports that she does not drink alcohol or use illicit drugs.  Allergies: No Known Allergies  Medications Prior to Admission  Medication Sig Dispense Refill  . aspirin 81 MG tablet Take 81 mg by mouth daily.      . calcium carbonate (OS-CAL) 600 MG TABS tablet Take 600 mg by mouth 2 (two) times daily with a meal.      . ezetimibe-simvastatin (VYTORIN) 10-40 MG per tablet Take 1 tablet by mouth daily.      Marland Kitchen lisinopril-hydrochlorothiazide (PRINZIDE,ZESTORETIC) 10-12.5 MG per tablet Take 1 tablet by mouth daily.      Marland Kitchen loratadine (CLARITIN) 10 MG tablet Take 10 mg by mouth daily.      . metFORMIN (GLUCOPHAGE) 500 MG tablet Take 500 mg by mouth 2 (two) times daily with a  meal.      . Multiple Vitamin (MULTIVITAMIN) tablet Take 1 tablet by mouth daily.        Results for orders placed during the hospital encounter of 02/25/14 (from the past 48 hour(s))  GLUCOSE, CAPILLARY     Status: Abnormal   Collection Time    02/25/14  8:05 AM      Result Value Ref Range   Glucose-Capillary 110 (*) 70 - 99 mg/dL    No results found.   A comprehensive review of systems was negative except for: Eyes: positive for contacts/glasses  Blood pressure 145/88, pulse 70, temperature 98.2 F (36.8 C), temperature source Oral, resp. rate 20, height 5\' 7"  (1.702 m), weight 80.287 kg (177 lb), SpO2 100.00%.  General appearance: alert, cooperative and appears stated age Head: Normocephalic, without obvious abnormality, atraumatic Neck: supple, symmetrical, trachea midline Resp: clear to auscultation bilaterally Cardio: regular rate and rhythm GI: non tender Extremities: intact sensation and capillary refill all digits.  +epl/fpl/io.  volar mass right thumb.  no wounds.  no erythema. Pulses: 2+ and symmetric Skin: Skin color, texture, turgor normal. No rashes or lesions Neurologic: Grossly normal Incision/Wound: none  Assessment/Plan Right thumb mass.  Non operative and operative treatment options were discussed with the patient and patient wishes to proceed with operative treatment. Risks, benefits, and alternatives of surgery were discussed and the patient agrees with the plan of care.   Tennis Must 02/25/2014, 8:25 AM

## 2014-02-25 NOTE — Op Note (Signed)
[id number] 

## 2014-02-25 NOTE — Anesthesia Postprocedure Evaluation (Signed)
  Anesthesia Post-op Note  Patient: Lisa Bradshaw  Procedure(s) Performed: Procedure(s): EXCISION MASS RIGHT THUMB   (Right)  Patient Location: PACU  Anesthesia Type:General  Level of Consciousness: awake and alert   Airway and Oxygen Therapy: Patient Spontanous Breathing  Post-op Pain: mild  Post-op Assessment: Post-op Vital signs reviewed, Patient's Cardiovascular Status Stable, Respiratory Function Stable, Patent Airway, No signs of Nausea or vomiting and Pain level controlled  Post-op Vital Signs: Reviewed and stable  Last Vitals:  Filed Vitals:   02/25/14 1002  BP:   Pulse: 62  Temp: 36.8 C  Resp: 20    Complications: No apparent anesthesia complications

## 2014-02-25 NOTE — Anesthesia Procedure Notes (Signed)
Procedure Name: LMA Insertion Date/Time: 02/25/2014 8:41 AM Performed by: Lyndee Leo Pre-anesthesia Checklist: Patient identified, Emergency Drugs available, Suction available and Patient being monitored Patient Re-evaluated:Patient Re-evaluated prior to inductionOxygen Delivery Method: Circle System Utilized Preoxygenation: Pre-oxygenation with 100% oxygen Intubation Type: IV induction Ventilation: Mask ventilation without difficulty LMA: LMA inserted LMA Size: 4.0 Number of attempts: 1 Airway Equipment and Method: bite block Placement Confirmation: positive ETCO2 Tube secured with: Tape Dental Injury: Teeth and Oropharynx as per pre-operative assessment

## 2014-02-25 NOTE — Brief Op Note (Signed)
02/25/2014  9:14 AM  PATIENT:  Lisa Bradshaw  68 y.o. female  PRE-OPERATIVE DIAGNOSIS:  RIGHT THUMB MASS   POST-OPERATIVE DIAGNOSIS:  RIGHT THUMB MASS   PROCEDURE:  Procedure(s): EXCISION MASS RIGHT THUMB   (Right)  SURGEON:  Surgeon(s) and Role:    * Tennis Must, MD - Primary  PHYSICIAN ASSISTANT:   ASSISTANTS: none   ANESTHESIA:   general  EBL:     BLOOD ADMINISTERED:none  DRAINS: none   LOCAL MEDICATIONS USED:  MARCAINE     SPECIMEN:  Source of Specimen:  right thumb  DISPOSITION OF SPECIMEN:  PATHOLOGY  COUNTS:  YES  TOURNIQUET:   Total Tourniquet Time Documented: Forearm (Right) - 21 minutes Total: Forearm (Right) - 21 minutes   DICTATION: .Other Dictation: Dictation Number [id number]  PLAN OF CARE: Discharge to home after PACU  PATIENT DISPOSITION:  PACU - hemodynamically stable.

## 2014-02-25 NOTE — Discharge Instructions (Addendum)

## 2014-02-25 NOTE — Transfer of Care (Signed)
Immediate Anesthesia Transfer of Care Note  Patient: Lisa Bradshaw  Procedure(s) Performed: Procedure(s): EXCISION MASS RIGHT THUMB   (Right)  Patient Location: PACU  Anesthesia Type:General  Level of Consciousness: awake, sedated and patient cooperative  Airway & Oxygen Therapy: Patient Spontanous Breathing and Patient connected to face mask oxygen  Post-op Assessment: Report given to PACU RN and Post -op Vital signs reviewed and stable  Post vital signs: Reviewed and stable  Complications: No apparent anesthesia complications

## 2014-02-26 ENCOUNTER — Encounter (HOSPITAL_BASED_OUTPATIENT_CLINIC_OR_DEPARTMENT_OTHER): Payer: Self-pay | Admitting: Orthopedic Surgery

## 2014-02-26 NOTE — Op Note (Addendum)
Lisa Bradshaw, Lisa Bradshaw NO.:  [id number]  MEDICAL RECORD NO.:  [id number]  LOCATION:                                 FACILITY:  PHYSICIAN:  Leanora Cover, MD             DATE OF BIRTH:  DATE OF PROCEDURE:  02/25/2014 DATE OF DISCHARGE:                              OPERATIVE REPORT   PREOPERATIVE DIAGNOSIS:  Right thumb mass.  POSTOPERATIVE DIAGNOSIS:  Right thumb mass.  PROCEDURE:  Excision of mass right thumb with elliptical skin incision.  SURGEON:  Leanora Cover, MD  ASSISTANT:  None.  ANESTHESIA:  General.  IV FLUIDS:  Per anesthesia flow sheet.  ESTIMATED BLOOD LOSS:  Minimal.  COMPLICATIONS:  None.  SPECIMENS:  Right thumb mass to Pathology, suture distally.  TOURNIQUET TIME:  21 minutes.  DISPOSITION:  Stable to PACU.  INDICATIONS:  Lisa Bradshaw is a 68 year old right-hand dominant female who has noted a mass on her right thumb for approximately 3 months.  It is bothersome to her.  She wishes to have excised.  Risks, benefits and alternatives of the surgery were discussed including the risk of blood loss; infection; damage to nerves, vessels, tendons, ligaments, bone; failure of surgery; need for additional surgery; complications with wound healing; continued pain and recurrence of the mass.  She voiced understanding of these risks and elected to proceed.  OPERATIVE COURSE:  After being identified preoperatively by myself, the patient and I agreed upon the procedure and site of procedure.  Surgical site was marked.  The risks, benefits, and alternatives of the surgery were reviewed, she wished to proceed.  Surgical consent had been signed. She was given IV Ancef as preoperative antibiotic prophylaxis.  She was transferred to the operating room and placed on the operating room table in supine position with the right upper extremity on an armboard. General anesthesia was induced by anesthesiologist.  Right upper extremity was prepped and draped in  normal sterile orthopedic fashion. A surgical pause was performed between the surgeons, anesthesia, and operating room staff, and all were in agreement as to the patient, procedure, site of procedure.  Tourniquet at the proximal aspect of the forearm was inflated to 250 mmHg after exsanguination of the limb with an Esmarch bandage.  Incision was made at the radial side of the mass on the ulnar side of the thumb.  This was carried into the subcutaneous tissues by spreading technique.  The ulnar digital nerve and artery were identified and protected throughout the case.  The mass was felt to be entirely within the skin itself.  The subcutaneous tissues and deeper tissues were palpated and no mass was found.  The skin mass was then ellipsed out.  Suture was placed distally and was sent to Pathology for examination.  The wound was again inspected and palpated, and no mass was present.  The wound was copiously irrigated with sterile saline.  It was closed with 4-0 nylon in a horizontal mattress fashion. Again on palpation, no mass was able to be palpated.  Digital block was performed with 10 mL of 0.25% plain Marcaine to aid in postoperative analgesia.  The wound was  dressed with sterile Xeroform, 4x4s, and wrapped with a Coban dressing lightly.  Tourniquet was deflated at 21 minutes.  Fingertips were pink with brisk capillary refill after deflation of the tourniquet.  Operative drapes were broken down and the patient was awoken from anesthesia safely.  She has transferred back to stretcher and taken to PACU in stable condition.  I will see her back in the office in 1 week for postoperative followup.  I will give her Norco 5/325, 1-2 p.o. q.6 hours p.r.n. pain, dispensed #30.     Leanora Cover, MD     KK/MEDQ  D:  02/25/2014  T:  02/26/2014  Job:  [id number]  Addendum: Excised dimensions of the lesion are 0.4 x 0.8 cm.  Final diagnosis is verruca.

## 2014-03-13 DIAGNOSIS — R944 Abnormal results of kidney function studies: Secondary | ICD-10-CM | POA: Diagnosis not present

## 2014-08-29 DIAGNOSIS — I10 Essential (primary) hypertension: Secondary | ICD-10-CM | POA: Diagnosis not present

## 2014-08-29 DIAGNOSIS — E782 Mixed hyperlipidemia: Secondary | ICD-10-CM | POA: Diagnosis not present

## 2014-08-29 DIAGNOSIS — Z23 Encounter for immunization: Secondary | ICD-10-CM | POA: Diagnosis not present

## 2014-08-29 DIAGNOSIS — E119 Type 2 diabetes mellitus without complications: Secondary | ICD-10-CM | POA: Diagnosis not present

## 2014-12-20 DIAGNOSIS — M654 Radial styloid tenosynovitis [de Quervain]: Secondary | ICD-10-CM | POA: Diagnosis not present

## 2014-12-20 DIAGNOSIS — G5602 Carpal tunnel syndrome, left upper limb: Secondary | ICD-10-CM | POA: Diagnosis not present

## 2014-12-20 DIAGNOSIS — G5601 Carpal tunnel syndrome, right upper limb: Secondary | ICD-10-CM | POA: Diagnosis not present

## 2015-02-14 DIAGNOSIS — G5602 Carpal tunnel syndrome, left upper limb: Secondary | ICD-10-CM | POA: Diagnosis not present

## 2015-02-14 DIAGNOSIS — M654 Radial styloid tenosynovitis [de Quervain]: Secondary | ICD-10-CM | POA: Diagnosis not present

## 2015-04-29 DIAGNOSIS — I10 Essential (primary) hypertension: Secondary | ICD-10-CM | POA: Diagnosis not present

## 2015-04-29 DIAGNOSIS — Z23 Encounter for immunization: Secondary | ICD-10-CM | POA: Diagnosis not present

## 2015-04-29 DIAGNOSIS — E119 Type 2 diabetes mellitus without complications: Secondary | ICD-10-CM | POA: Diagnosis not present

## 2015-04-29 DIAGNOSIS — G4700 Insomnia, unspecified: Secondary | ICD-10-CM | POA: Diagnosis not present

## 2015-04-29 DIAGNOSIS — Z1389 Encounter for screening for other disorder: Secondary | ICD-10-CM | POA: Diagnosis not present

## 2015-04-29 DIAGNOSIS — Z0000 Encounter for general adult medical examination without abnormal findings: Secondary | ICD-10-CM | POA: Diagnosis not present

## 2015-04-29 DIAGNOSIS — E782 Mixed hyperlipidemia: Secondary | ICD-10-CM | POA: Diagnosis not present

## 2015-06-29 DIAGNOSIS — R509 Fever, unspecified: Secondary | ICD-10-CM | POA: Diagnosis not present

## 2015-06-30 ENCOUNTER — Emergency Department (HOSPITAL_COMMUNITY): Payer: Medicare Other

## 2015-06-30 ENCOUNTER — Encounter (HOSPITAL_COMMUNITY): Payer: Self-pay | Admitting: *Deleted

## 2015-06-30 ENCOUNTER — Emergency Department (HOSPITAL_COMMUNITY)
Admission: EM | Admit: 2015-06-30 | Discharge: 2015-06-30 | Disposition: A | Discharge status: Home / Self Care | Payer: Medicare Other | Attending: Physician Assistant | Admitting: Physician Assistant

## 2015-06-30 DIAGNOSIS — J159 Unspecified bacterial pneumonia: Secondary | ICD-10-CM | POA: Diagnosis not present

## 2015-06-30 DIAGNOSIS — M1990 Unspecified osteoarthritis, unspecified site: Secondary | ICD-10-CM | POA: Insufficient documentation

## 2015-06-30 DIAGNOSIS — J189 Pneumonia, unspecified organism: Secondary | ICD-10-CM | POA: Diagnosis not present

## 2015-06-30 DIAGNOSIS — Z792 Long term (current) use of antibiotics: Secondary | ICD-10-CM | POA: Diagnosis not present

## 2015-06-30 DIAGNOSIS — R05 Cough: Secondary | ICD-10-CM | POA: Diagnosis not present

## 2015-06-30 DIAGNOSIS — Z7982 Long term (current) use of aspirin: Secondary | ICD-10-CM | POA: Diagnosis not present

## 2015-06-30 DIAGNOSIS — Z86718 Personal history of other venous thrombosis and embolism: Secondary | ICD-10-CM | POA: Insufficient documentation

## 2015-06-30 DIAGNOSIS — Z79899 Other long term (current) drug therapy: Secondary | ICD-10-CM | POA: Diagnosis not present

## 2015-06-30 DIAGNOSIS — Z87448 Personal history of other diseases of urinary system: Secondary | ICD-10-CM | POA: Diagnosis not present

## 2015-06-30 DIAGNOSIS — E119 Type 2 diabetes mellitus without complications: Secondary | ICD-10-CM | POA: Insufficient documentation

## 2015-06-30 DIAGNOSIS — I10 Essential (primary) hypertension: Secondary | ICD-10-CM | POA: Insufficient documentation

## 2015-06-30 LAB — BASIC METABOLIC PANEL
Anion gap: 10 (ref 5–15)
BUN: 19 mg/dL (ref 6–20)
CALCIUM: 9.9 mg/dL (ref 8.9–10.3)
CHLORIDE: 98 mmol/L — AB (ref 101–111)
CO2: 27 mmol/L (ref 22–32)
CREATININE: 0.88 mg/dL (ref 0.44–1.00)
GFR calc Af Amer: >60 mL/min (ref >60)
GFR calc non Af Amer: >60 mL/min (ref >60)
Glucose, Bld: 136 mg/dL — ABNORMAL HIGH (ref 65–99)
Potassium: 3.6 mmol/L (ref 3.5–5.1)
Sodium: 135 mmol/L (ref 135–145)

## 2015-06-30 LAB — CBC
HCT: 45.2 % (ref 36.0–46.0)
HEMOGLOBIN: 15.5 g/dL — AB (ref 12.0–15.0)
MCH: 31.6 pg (ref 26.0–34.0)
MCHC: 34.3 g/dL (ref 30.0–36.0)
MCV: 92.1 fL (ref 78.0–100.0)
Platelets: 187 10*3/uL (ref 150–400)
RBC: 4.91 MIL/uL (ref 3.87–5.11)
RDW: 13.1 % (ref 11.5–15.5)
WBC: 8.6 10*3/uL (ref 4.0–10.5)

## 2015-06-30 MED ORDER — ALBUTEROL SULFATE HFA 108 (90 BASE) MCG/ACT IN AERS
2.0000 | INHALATION_SPRAY | RESPIRATORY_TRACT | Status: DC | PRN
Start: 2015-06-30 — End: 2015-06-30
  Administered 2015-06-30: 2 via RESPIRATORY_TRACT
  Filled 2015-06-30: qty 6.7

## 2015-06-30 NOTE — ED Notes (Signed)
Patient ambulated with no difficulty-oxygen did not drop below 93%.

## 2015-06-30 NOTE — ED Notes (Signed)
Pt reported nonproductive congested cough and fever since Thursday. Seen at  Urgent Care with dx PNA. Pt reported that she was given prescription but did not get them filled. Auscultated clear breath sounds bil and throughout. Resp even and unlabored.

## 2015-06-30 NOTE — ED Provider Notes (Signed)
CSN: [id number]     Arrival date & time 06/30/15  1207 History   First MD Initiated Contact with Patient 06/30/15 1502     Chief Complaint  Patient presents with  . Pneumonia      (Consider location/radiation/quality/duration/timing/severity/associated sxs/prior Treatment) HPI Comments: Patient presents to the ED with a chief complaint of cough.  Patient states that she was diagnosed yesterday with pneumonia at an urgent care.  States that she has not been feeling well for a week.  Reports feeling very fatigued.  Reports associated cough and fever.  She states that she was given an antibiotic shot yesterday and given a prescription for Levaquin, which she has not started taking.  She states that when she awoke this morning she felt worse, and decided to come to the ER.  Patient denies any hx of asthma, COPD, or smoking history.  There are no aggravating or alleviating factors.  The history is provided by the patient. No language interpreter was used.    Past Medical History  Diagnosis Date  . Arthritis   . Mass of finger of right hand 02/2014    thumb  . Non-insulin dependent type 2 diabetes mellitus   . Hypertension     under control with med., has been on med. > 20 yr.  . High cholesterol   . Seasonal allergies   . Dental crowns present   . Dental bridge present     upper and lower  . 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 History of DVT (deep vein thrombosis) 1992  . Pericardial effusion (noninflammatory) 2002    history of  - no longer sees cardiologist   Past Surgical History  Procedure Laterality Date  . Cesarean section  1969  . Abdominal hysterectomy  1992    complete  . 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 node bx.  . Transesophageal echocardiogram  08/23/2001  . Mastectomy Left 04/03/2004  . Foot neuroma surgery Left 2006  . Excision metacarpal mass Right 02/25/2014    Procedure: EXCISION MASS RIGHT THUMB  ;  Surgeon:  Tennis Must, MD;  Location: [facility];  Service: Orthopedics;  Laterality: Right;   Family History  Problem Relation Age of Onset  . Cancer - Other Mother   . Cancer - Other Father   . Stroke Father   . CAD Father    Social History  Substance Use Topics  . Smoking status: Never Smoker   . Smokeless tobacco: Never Used  . Alcohol Use: No   OB History    No data available     Review of Systems  Constitutional: Positive for fever. Negative for chills.  Respiratory: Positive for cough and wheezing. Negative for shortness of breath.   Cardiovascular: Negative for chest pain.  Gastrointestinal: Negative for nausea, vomiting, diarrhea and constipation.  Genitourinary: Negative for dysuria.      Allergies  Review of patient's allergies indicates no known allergies.  Home Medications   Prior to Admission medications   Medication Sig Start Date End Date Taking? Authorizing Provider  aspirin 81 MG tablet Take 81 mg by mouth daily.   Yes Historical Provider, MD  ezetimibe-simvastatin (VYTORIN) 10-40 MG per tablet Take 1 tablet by mouth daily.   Yes Historical Provider, MD  HYDROcodone-acetaminophen [facility]) 5-325 MG per tablet 1-2 tabs po q6 hours prn pain Patient not taking: Reported on 06/30/2015 02/25/14   Leanora Cover,  MD  levofloxacin (LEVAQUIN) 500 MG tablet Take 500 mg by mouth daily.    Historical Provider, MD  lisinopril-hydrochlorothiazide (PRINZIDE,ZESTORETIC) 10-12.5 MG per tablet Take 1 tablet by mouth daily.   Yes Historical Provider, MD  loratadine (CLARITIN) 10 MG tablet Take 10 mg by mouth daily as needed for allergies.    Yes Historical Provider, MD  metFORMIN (GLUCOPHAGE) 500 MG tablet Take 500 mg by mouth 2 (two) times daily with a meal.   Yes Historical Provider, MD  Multiple Vitamin (MULTIVITAMIN) tablet Take 1 tablet by mouth daily.   Yes Historical Provider, MD  ondansetron (ZOFRAN) 4 MG tablet Take 4 mg by mouth every 6 (six) hours as needed for  nausea or vomiting.    Historical Provider, MD   BP 128/64 mmHg  Pulse 89  Temp(Src) 99.6 F (37.6 C) (Oral)  Resp 18  SpO2 94% Physical Exam  Constitutional: She is oriented to person, place, and time. She appears well-developed and well-nourished.  HENT:  Head: Normocephalic and atraumatic.  Eyes: Conjunctivae and EOM are normal. Pupils are equal, round, and reactive to light.  Neck: Normal range of motion. Neck supple.  Cardiovascular: Normal rate and regular rhythm.  Exam reveals no gallop and no friction rub.   No murmur heard. Pulmonary/Chest: Effort normal and breath sounds normal. No respiratory distress. She has no wheezes. She has no rales. She exhibits no tenderness.  Mild right sided crackles  Abdominal: Soft. Bowel sounds are normal. She exhibits no distension and no mass. There is no tenderness. There is no rebound and no guarding.  Musculoskeletal: Normal range of motion. She exhibits no edema or tenderness.  Neurological: She is alert and oriented to person, place, and time.  Skin: Skin is warm and dry.  Psychiatric: She has a normal mood and affect. Her behavior is normal. Judgment and thought content normal.  Nursing note and vitals reviewed.   ED Course  Procedures (including critical care time) Labs Review Labs Reviewed  CBC - Abnormal; Notable for the following:    Hemoglobin 15.5 (*)    All other components within normal limits  BASIC METABOLIC PANEL - Abnormal; Notable for the following:    Chloride 98 (*)    Glucose, Bld 136 (*)    All other components within normal limits    Imaging Review Dg Chest 2 View  06/30/2015   CLINICAL DATA:  Patient complains of cough, wheezing, decreased appetite. She was diagnosed with pneumonia 1 day ago.  EXAM: CHEST  2 VIEW  COMPARISON:  03/19/2004  FINDINGS: Cardiomediastinal silhouette is normal. Mediastinal contours appear intact.  There is a right midlung airspace consolidation, likely within the superior segment of  right lower lobe. There is no evidence of pleural effusion or pneumothorax.  Osseous structures are without acute abnormality. Soft tissues are grossly normal.  IMPRESSION: Right lung airspace consolidation, likely representing pneumonia. Follow-up to resolution to exclude underlying etiology such as obstructing endobronchial lesion, is recommended.   Electronically Signed   By: Fidela Salisbury M.D.   On: 06/30/2015 13:32   I have personally reviewed and evaluated these images and lab results as part of my medical decision-making.   EKG Interpretation None      MDM   Final diagnoses:  CAP (community acquired pneumonia)    Patient with pneumonia.  Diagnosed yesterday.  VSS. Low-grade temp at 99.6.  Never started her abx.  She lives at home, but her son can stay with her.  Believe it to be  reasonable to give outpatient tx a chance to work, since she never got her abx.  She has not hx of asthma, COPD, or smoking hx.  CURB 65 score is 1, PORT score is 61.  Patient ambulates maintaining >90% O2 sat.  Patient seen by and discussed with Dr. Thomasene Lot, who agrees with the plan and recommends adding an inhaler for wheezing.  Return precautions discussed.  Patient filled her Levaquin on the way to the ED.  Encouraged compliance in taking this.    Montine Circle, PA-C 06/30/15 [place], MD 06/30/15 [id number]

## 2015-06-30 NOTE — ED Notes (Addendum)
Pt reports not feeling well since last week, decreased appetite, cough, pt went to urgent care yesterday, was dx with PNA, xray states "right lower lobe pneumonia".  Pt reports a discomfort/ wheezing feeling in chest 4/10. Reports increased weakness. Denies SOB. Pt reports she was told if she did not fell better to come to ED, pt called her pcp who recommended she come to ED.  Pt has filled abx, but has not started taking it.

## 2015-06-30 NOTE — Discharge Instructions (Signed)
Please take your antibiotics as directed.  Please use the inhaler as needed up to 1 time every 4 hours.  Pneumonia Pneumonia is an infection of the lungs.  CAUSES Pneumonia may be caused by bacteria or a virus. Usually, these infections are caused by breathing infectious particles into the lungs (respiratory tract). SIGNS AND SYMPTOMS   Cough.  Fever.  Chest pain.  Increased rate of breathing.  Wheezing.  Mucus production. DIAGNOSIS  If you have the common symptoms of pneumonia, your health care provider will typically confirm the diagnosis with a chest X-ray. The X-ray will show an abnormality in the lung (pulmonary infiltrate) if you have pneumonia. Other tests of your blood, urine, or sputum may be done to find the specific cause of your pneumonia. Your health care provider may also do tests (blood gases or pulse oximetry) to see how well your lungs are working. TREATMENT  Some forms of pneumonia may be spread to other people when you cough or sneeze. You may be asked to wear a mask before and during your exam. Pneumonia that is caused by bacteria is treated with antibiotic medicine. Pneumonia that is caused by the influenza virus may be treated with an antiviral medicine. Most other viral infections must run their course. These infections will not respond to antibiotics.  HOME CARE INSTRUCTIONS   Cough suppressants may be used if you are losing too much rest. However, coughing protects you by clearing your lungs. You should avoid using cough suppressants if you can.  Your health care provider may have prescribed medicine if he or she thinks your pneumonia is caused by bacteria or influenza. Finish your medicine even if you start to feel better.  Your health care provider may also prescribe an expectorant. This loosens the mucus to be coughed up.  Take medicines only as directed by your health care provider.  Do not smoke. Smoking is a common cause of bronchitis and can contribute  to pneumonia. If you are a smoker and continue to smoke, your cough may last several weeks after your pneumonia has cleared.  A cold steam vaporizer or humidifier in your room or home may help loosen mucus.  Coughing is often worse at night. Sleeping in a semi-upright position in a recliner or using a couple pillows under your head will help with this.  Get rest as you feel it is needed. Your body will usually let you know when you need to rest. PREVENTION A pneumococcal shot (vaccine) is available to prevent a common bacterial cause of pneumonia. This is usually suggested for:  People over 40 years old.  Patients on chemotherapy.  People with chronic lung problems, such as bronchitis or emphysema.  People with immune system problems. If you are over 65 or have a high risk condition, you may receive the pneumococcal vaccine if you have not received it before. In some countries, a routine influenza vaccine is also recommended. This vaccine can help prevent some cases of pneumonia.You may be offered the influenza vaccine as part of your care. If you smoke, it is time to quit. You may receive instructions on how to stop smoking. Your health care provider can provide medicines and counseling to help you quit. SEEK MEDICAL CARE IF: You have a fever. SEEK IMMEDIATE MEDICAL CARE IF:   Your illness becomes worse. This is especially true if you are elderly or weakened from any other disease.  You cannot control your cough with suppressants and are losing sleep.  You begin  coughing up blood.  You develop pain which is getting worse or is uncontrolled with medicines.  Any of the symptoms which initially brought you in for treatment are getting worse rather than better.  You develop shortness of breath or chest pain. MAKE SURE YOU:   Understand these instructions.  Will watch your condition.  Will get help right away if you are not doing well or get worse. Document Released: 10/25/2005  Document Revised: 03/11/2014 Document Reviewed: 01/14/2011 [facility] Patient Information 2015 [place], [place]. This information is not intended to replace advice given to you by your health care provider. Make sure you discuss any questions you have with your health care provider.

## 2015-09-24 DIAGNOSIS — H31001 Unspecified chorioretinal scars, right eye: Secondary | ICD-10-CM | POA: Diagnosis not present

## 2015-09-24 DIAGNOSIS — E119 Type 2 diabetes mellitus without complications: Secondary | ICD-10-CM | POA: Diagnosis not present

## 2015-09-24 DIAGNOSIS — H2513 Age-related nuclear cataract, bilateral: Secondary | ICD-10-CM | POA: Diagnosis not present

## 2015-09-24 DIAGNOSIS — H33301 Unspecified retinal break, right eye: Secondary | ICD-10-CM | POA: Diagnosis not present

## 2015-10-29 DIAGNOSIS — E119 Type 2 diabetes mellitus without complications: Secondary | ICD-10-CM | POA: Diagnosis not present

## 2015-10-29 DIAGNOSIS — J069 Acute upper respiratory infection, unspecified: Secondary | ICD-10-CM | POA: Diagnosis not present

## 2015-10-29 DIAGNOSIS — E782 Mixed hyperlipidemia: Secondary | ICD-10-CM | POA: Diagnosis not present

## 2015-10-29 DIAGNOSIS — I10 Essential (primary) hypertension: Secondary | ICD-10-CM | POA: Diagnosis not present

## 2015-11-06 ENCOUNTER — Encounter: Payer: Self-pay | Admitting: Podiatry

## 2015-11-06 ENCOUNTER — Ambulatory Visit (INDEPENDENT_AMBULATORY_CARE_PROVIDER_SITE_OTHER): Payer: Medicare Other | Admitting: Podiatry

## 2015-11-06 VITALS — BP 122/70 | HR 70 | Resp 16 | Ht 67.0 in | Wt 175.0 lb

## 2015-11-06 DIAGNOSIS — L600 Ingrowing nail: Secondary | ICD-10-CM | POA: Diagnosis not present

## 2015-11-06 NOTE — Progress Notes (Signed)
   Subjective:    Patient ID: Lisa Bradshaw, female    DOB: [birth year], 69 y.o.   MRN: [id number]  HPI Patient presents with a nail problem in their right foot, great toe-lateral side; x1 month   Review of Systems  HENT: Positive for sinus pressure, sneezing and sore throat.   Respiratory: Positive for cough.   All other systems reviewed and are negative.      Objective:   Physical Exam        Assessment & Plan:

## 2015-11-06 NOTE — Patient Instructions (Signed)

## 2015-11-06 NOTE — Progress Notes (Signed)
Subjective:     Patient ID: Lisa Bradshaw, female   DOB: Jul 20, 1946, 69 y.o.   MRN: [id number]  HPI patient presents with pain in the lateral side of the right big toe 1 month that she has tried to trim herself   Review of Systems  All other systems reviewed and are negative.      Objective:   Physical Exam  Constitutional: She is oriented to person, place, and time.  Cardiovascular: Intact distal pulses.   Musculoskeletal: Normal range of motion.  Neurological: She is oriented to person, place, and time.  Skin: Skin is warm.  Nursing note and vitals reviewed.  neurovascular status intact muscle strength adequate with patient found to have discomfort at rest right hallux nail with no redness or drainage noted currently. Good digital perfusion noted well oriented 3     Assessment:     Mild ingrown toenail deformity right hallux lateral with no indication of paronychia    Plan:     Reviewed soaks H&P and diabetic foot education. Reappoint if symptoms persist and may require permanent procedure

## 2015-11-11 ENCOUNTER — Telehealth: Payer: Self-pay | Admitting: *Deleted

## 2015-11-11 NOTE — Telephone Encounter (Signed)
Left message for patient at [phone] (Home #) to check to see how they were doing from their ingrown toenail procedure that was performed on Thursday, November 06, 2015. Waiting for a response.

## 2015-11-12 DIAGNOSIS — Z23 Encounter for immunization: Secondary | ICD-10-CM | POA: Diagnosis not present

## 2016-05-05 DIAGNOSIS — E782 Mixed hyperlipidemia: Secondary | ICD-10-CM | POA: Diagnosis not present

## 2016-05-05 DIAGNOSIS — I10 Essential (primary) hypertension: Secondary | ICD-10-CM | POA: Diagnosis not present

## 2016-05-05 DIAGNOSIS — Z7984 Long term (current) use of oral hypoglycemic drugs: Secondary | ICD-10-CM | POA: Diagnosis not present

## 2016-05-05 DIAGNOSIS — Z1389 Encounter for screening for other disorder: Secondary | ICD-10-CM | POA: Diagnosis not present

## 2016-05-05 DIAGNOSIS — E119 Type 2 diabetes mellitus without complications: Secondary | ICD-10-CM | POA: Diagnosis not present

## 2016-05-05 DIAGNOSIS — Z0000 Encounter for general adult medical examination without abnormal findings: Secondary | ICD-10-CM | POA: Diagnosis not present

## 2016-07-16 DIAGNOSIS — C50911 Malignant neoplasm of unspecified site of right female breast: Secondary | ICD-10-CM | POA: Diagnosis not present

## 2016-07-16 DIAGNOSIS — C50912 Malignant neoplasm of unspecified site of left female breast: Secondary | ICD-10-CM | POA: Diagnosis not present

## 2016-08-27 DIAGNOSIS — Z23 Encounter for immunization: Secondary | ICD-10-CM | POA: Diagnosis not present

## 2016-09-24 DIAGNOSIS — H31001 Unspecified chorioretinal scars, right eye: Secondary | ICD-10-CM | POA: Diagnosis not present

## 2016-09-24 DIAGNOSIS — E119 Type 2 diabetes mellitus without complications: Secondary | ICD-10-CM | POA: Diagnosis not present

## 2016-09-24 DIAGNOSIS — H43813 Vitreous degeneration, bilateral: Secondary | ICD-10-CM | POA: Diagnosis not present

## 2016-09-24 DIAGNOSIS — Z7984 Long term (current) use of oral hypoglycemic drugs: Secondary | ICD-10-CM | POA: Diagnosis not present

## 2016-10-01 IMAGING — CR DG CHEST 2V
2 series · 2 of 2 positions shown · non-contrast
Comparison: 03/19/2004

CLINICAL DATA: Patient complains of cough, wheezing, decreased
appetite. She was diagnosed with pneumonia 1 day ago.

EXAM:
CHEST  2 VIEW

[w chest pa]
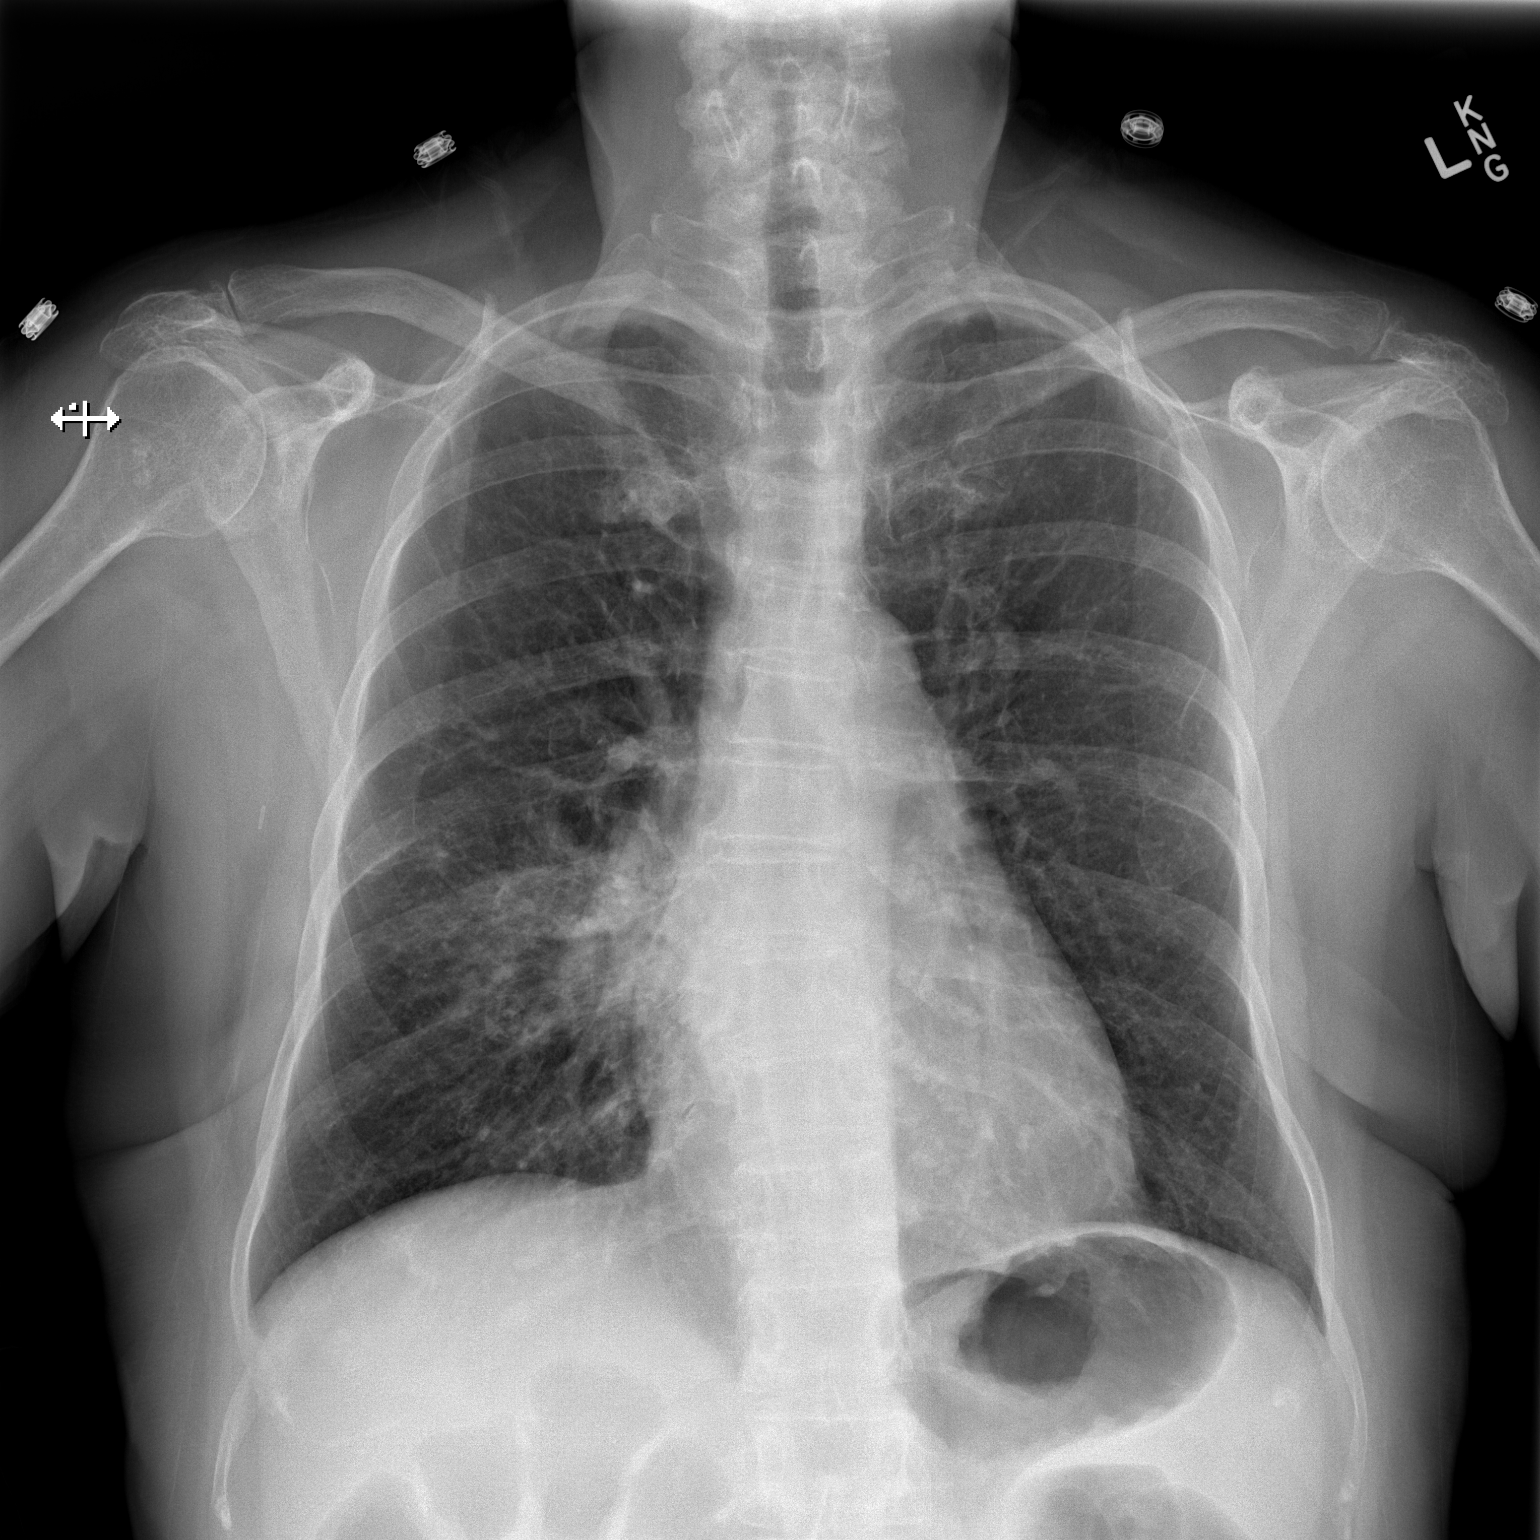

[w chest lat]
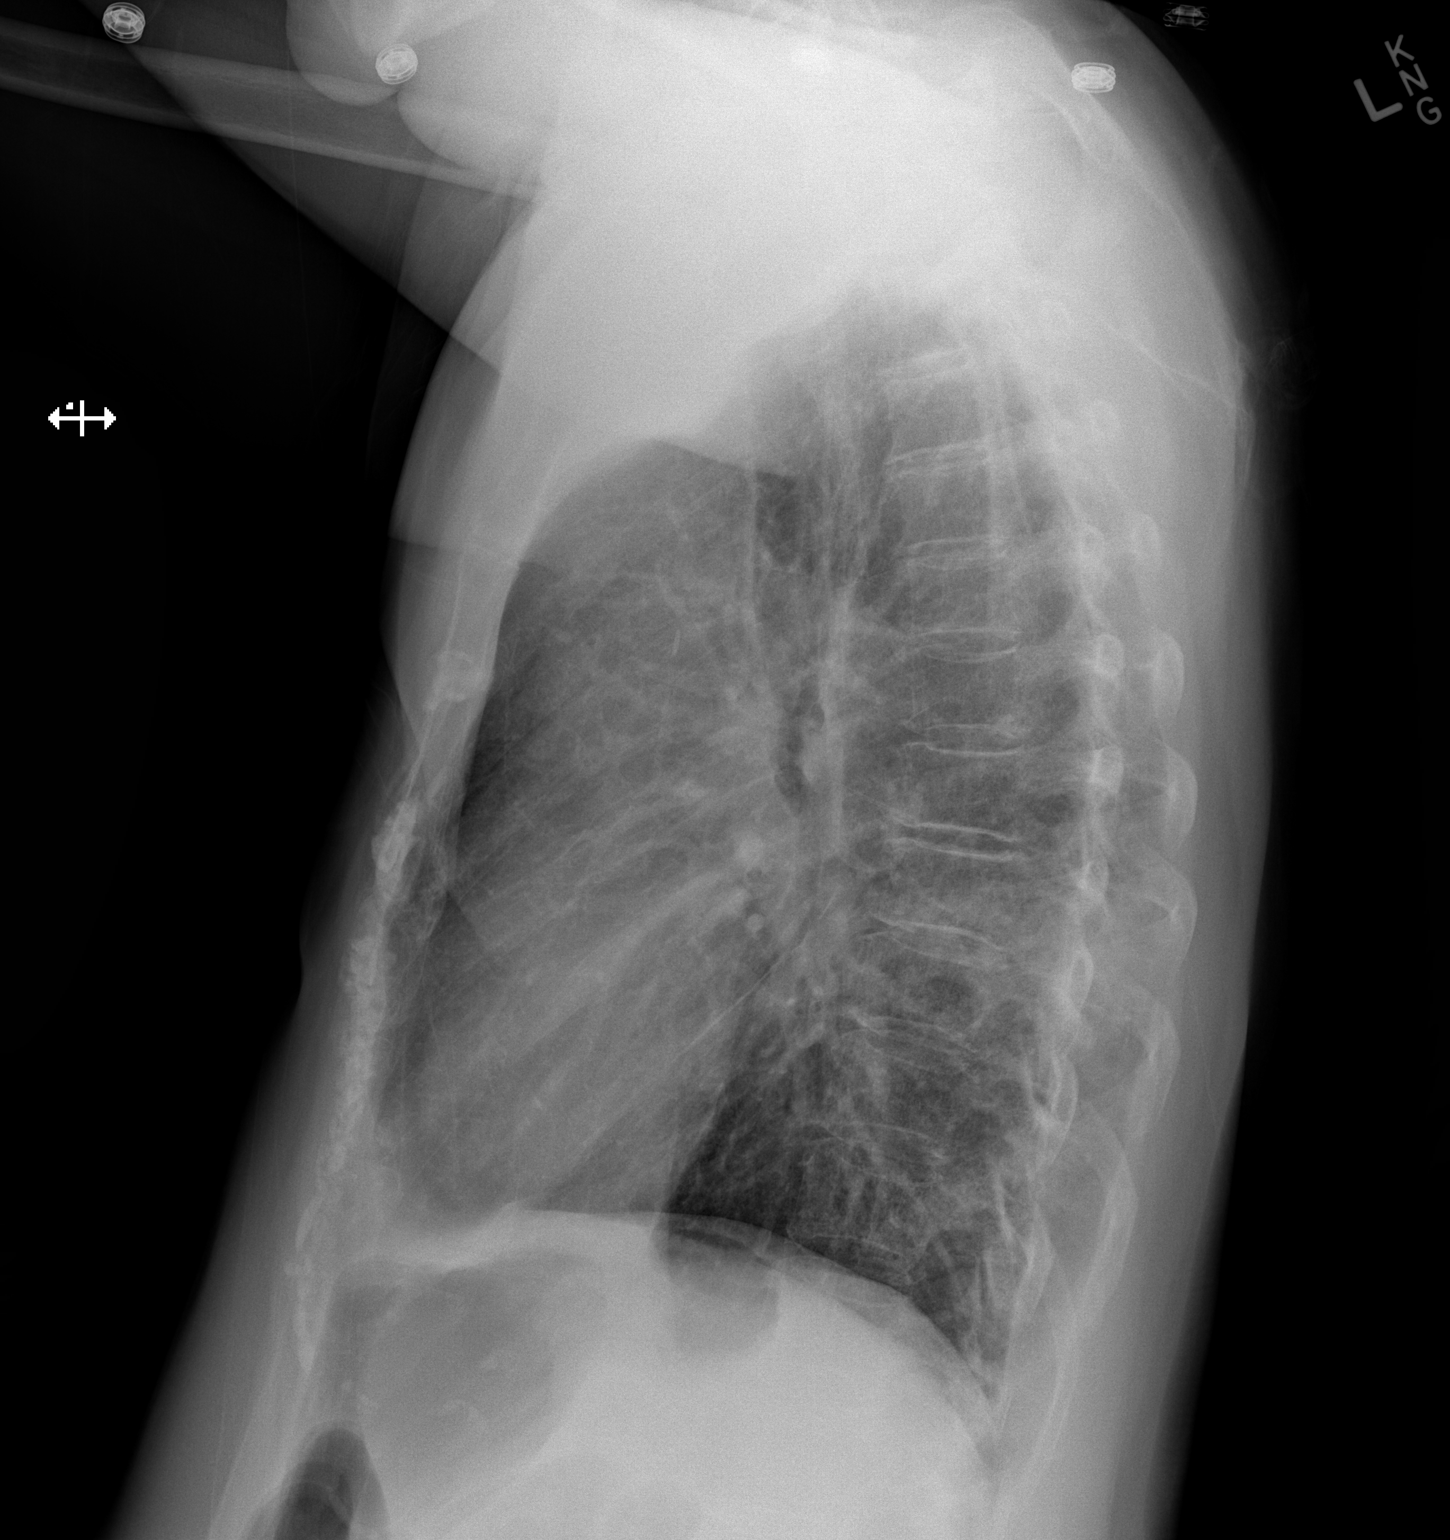

[2 of 2 positions shown; findings below may reference images not displayed]

FINDINGS: Cardiomediastinal silhouette is normal. Mediastinal contours appear
intact.

There is a right midlung airspace consolidation, likely within the
superior segment of right lower lobe. There is no evidence of
pleural effusion or pneumothorax.

Osseous structures are without acute abnormality. Soft tissues are
grossly normal.
IMPRESSION: Right lung airspace consolidation, likely representing pneumonia.
Follow-up to resolution to exclude underlying etiology such as
obstructing endobronchial lesion, is recommended.

## 2016-11-17 DIAGNOSIS — L989 Disorder of the skin and subcutaneous tissue, unspecified: Secondary | ICD-10-CM | POA: Diagnosis not present

## 2016-11-17 DIAGNOSIS — E782 Mixed hyperlipidemia: Secondary | ICD-10-CM | POA: Diagnosis not present

## 2016-11-17 DIAGNOSIS — M25551 Pain in right hip: Secondary | ICD-10-CM | POA: Diagnosis not present

## 2016-11-17 DIAGNOSIS — Z7984 Long term (current) use of oral hypoglycemic drugs: Secondary | ICD-10-CM | POA: Diagnosis not present

## 2016-11-17 DIAGNOSIS — I10 Essential (primary) hypertension: Secondary | ICD-10-CM | POA: Diagnosis not present

## 2016-11-17 DIAGNOSIS — E119 Type 2 diabetes mellitus without complications: Secondary | ICD-10-CM | POA: Diagnosis not present

## 2016-11-17 DIAGNOSIS — M545 Low back pain: Secondary | ICD-10-CM | POA: Diagnosis not present

## 2016-12-28 DIAGNOSIS — Z23 Encounter for immunization: Secondary | ICD-10-CM | POA: Diagnosis not present

## 2016-12-28 DIAGNOSIS — L723 Sebaceous cyst: Secondary | ICD-10-CM | POA: Diagnosis not present

## 2016-12-28 DIAGNOSIS — L989 Disorder of the skin and subcutaneous tissue, unspecified: Secondary | ICD-10-CM | POA: Diagnosis not present

## 2016-12-28 DIAGNOSIS — L309 Dermatitis, unspecified: Secondary | ICD-10-CM | POA: Diagnosis not present

## 2017-01-26 DIAGNOSIS — L720 Epidermal cyst: Secondary | ICD-10-CM | POA: Diagnosis not present

## 2017-01-26 DIAGNOSIS — L989 Disorder of the skin and subcutaneous tissue, unspecified: Secondary | ICD-10-CM | POA: Diagnosis not present

## 2017-01-26 DIAGNOSIS — L723 Sebaceous cyst: Secondary | ICD-10-CM | POA: Diagnosis not present

## 2017-02-17 DIAGNOSIS — Z7984 Long term (current) use of oral hypoglycemic drugs: Secondary | ICD-10-CM | POA: Diagnosis not present

## 2017-02-17 DIAGNOSIS — E119 Type 2 diabetes mellitus without complications: Secondary | ICD-10-CM | POA: Diagnosis not present

## 2017-03-23 DIAGNOSIS — C50912 Malignant neoplasm of unspecified site of left female breast: Secondary | ICD-10-CM | POA: Diagnosis not present

## 2017-03-23 DIAGNOSIS — C50911 Malignant neoplasm of unspecified site of right female breast: Secondary | ICD-10-CM | POA: Diagnosis not present

## 2017-04-13 DIAGNOSIS — C50911 Malignant neoplasm of unspecified site of right female breast: Secondary | ICD-10-CM | POA: Diagnosis not present

## 2017-04-13 DIAGNOSIS — C50912 Malignant neoplasm of unspecified site of left female breast: Secondary | ICD-10-CM | POA: Diagnosis not present

## 2017-04-26 DIAGNOSIS — C50912 Malignant neoplasm of unspecified site of left female breast: Secondary | ICD-10-CM | POA: Diagnosis not present

## 2017-04-26 DIAGNOSIS — C50911 Malignant neoplasm of unspecified site of right female breast: Secondary | ICD-10-CM | POA: Diagnosis not present

## 2017-08-30 DIAGNOSIS — E119 Type 2 diabetes mellitus without complications: Secondary | ICD-10-CM | POA: Diagnosis not present

## 2017-08-30 DIAGNOSIS — Z23 Encounter for immunization: Secondary | ICD-10-CM | POA: Diagnosis not present

## 2017-08-30 DIAGNOSIS — Z1389 Encounter for screening for other disorder: Secondary | ICD-10-CM | POA: Diagnosis not present

## 2017-08-30 DIAGNOSIS — E782 Mixed hyperlipidemia: Secondary | ICD-10-CM | POA: Diagnosis not present

## 2017-08-30 DIAGNOSIS — I10 Essential (primary) hypertension: Secondary | ICD-10-CM | POA: Diagnosis not present

## 2017-08-30 DIAGNOSIS — L659 Nonscarring hair loss, unspecified: Secondary | ICD-10-CM | POA: Diagnosis not present

## 2017-08-30 DIAGNOSIS — E2839 Other primary ovarian failure: Secondary | ICD-10-CM | POA: Diagnosis not present

## 2017-08-30 DIAGNOSIS — Z0000 Encounter for general adult medical examination without abnormal findings: Secondary | ICD-10-CM | POA: Diagnosis not present

## 2017-10-26 DIAGNOSIS — M8588 Other specified disorders of bone density and structure, other site: Secondary | ICD-10-CM | POA: Diagnosis not present

## 2017-10-26 DIAGNOSIS — E2839 Other primary ovarian failure: Secondary | ICD-10-CM | POA: Diagnosis not present

## 2017-11-10 DIAGNOSIS — Z7984 Long term (current) use of oral hypoglycemic drugs: Secondary | ICD-10-CM | POA: Diagnosis not present

## 2017-11-10 DIAGNOSIS — H31001 Unspecified chorioretinal scars, right eye: Secondary | ICD-10-CM | POA: Diagnosis not present

## 2017-11-10 DIAGNOSIS — H04123 Dry eye syndrome of bilateral lacrimal glands: Secondary | ICD-10-CM | POA: Diagnosis not present

## 2017-11-10 DIAGNOSIS — H524 Presbyopia: Secondary | ICD-10-CM | POA: Diagnosis not present

## 2017-11-10 DIAGNOSIS — H2513 Age-related nuclear cataract, bilateral: Secondary | ICD-10-CM | POA: Diagnosis not present

## 2018-01-03 DIAGNOSIS — R232 Flushing: Secondary | ICD-10-CM | POA: Diagnosis not present

## 2018-01-03 DIAGNOSIS — R079 Chest pain, unspecified: Secondary | ICD-10-CM | POA: Diagnosis not present

## 2018-01-03 DIAGNOSIS — J0110 Acute frontal sinusitis, unspecified: Secondary | ICD-10-CM | POA: Diagnosis not present

## 2018-02-28 DIAGNOSIS — Z7984 Long term (current) use of oral hypoglycemic drugs: Secondary | ICD-10-CM | POA: Diagnosis not present

## 2018-02-28 DIAGNOSIS — I10 Essential (primary) hypertension: Secondary | ICD-10-CM | POA: Diagnosis not present

## 2018-02-28 DIAGNOSIS — E782 Mixed hyperlipidemia: Secondary | ICD-10-CM | POA: Diagnosis not present

## 2018-02-28 DIAGNOSIS — E1169 Type 2 diabetes mellitus with other specified complication: Secondary | ICD-10-CM | POA: Diagnosis not present

## 2018-03-20 DIAGNOSIS — C50911 Malignant neoplasm of unspecified site of right female breast: Secondary | ICD-10-CM | POA: Diagnosis not present

## 2018-03-20 DIAGNOSIS — C50912 Malignant neoplasm of unspecified site of left female breast: Secondary | ICD-10-CM | POA: Diagnosis not present

## 2018-09-20 DIAGNOSIS — Z1389 Encounter for screening for other disorder: Secondary | ICD-10-CM | POA: Diagnosis not present

## 2018-09-20 DIAGNOSIS — Z0000 Encounter for general adult medical examination without abnormal findings: Secondary | ICD-10-CM | POA: Diagnosis not present

## 2018-09-20 DIAGNOSIS — Z7984 Long term (current) use of oral hypoglycemic drugs: Secondary | ICD-10-CM | POA: Diagnosis not present

## 2018-09-20 DIAGNOSIS — C50911 Malignant neoplasm of unspecified site of right female breast: Secondary | ICD-10-CM | POA: Diagnosis not present

## 2018-09-20 DIAGNOSIS — E1169 Type 2 diabetes mellitus with other specified complication: Secondary | ICD-10-CM | POA: Diagnosis not present

## 2018-09-20 DIAGNOSIS — E782 Mixed hyperlipidemia: Secondary | ICD-10-CM | POA: Diagnosis not present

## 2018-09-20 DIAGNOSIS — Z23 Encounter for immunization: Secondary | ICD-10-CM | POA: Diagnosis not present

## 2018-09-20 DIAGNOSIS — C50912 Malignant neoplasm of unspecified site of left female breast: Secondary | ICD-10-CM | POA: Diagnosis not present

## 2018-09-20 DIAGNOSIS — I10 Essential (primary) hypertension: Secondary | ICD-10-CM | POA: Diagnosis not present

## 2018-09-21 DIAGNOSIS — C50911 Malignant neoplasm of unspecified site of right female breast: Secondary | ICD-10-CM | POA: Diagnosis not present

## 2018-09-21 DIAGNOSIS — C50912 Malignant neoplasm of unspecified site of left female breast: Secondary | ICD-10-CM | POA: Diagnosis not present

## 2018-12-11 DIAGNOSIS — K640 First degree hemorrhoids: Secondary | ICD-10-CM | POA: Diagnosis not present

## 2018-12-11 DIAGNOSIS — Z800 Family history of malignant neoplasm of digestive organs: Secondary | ICD-10-CM | POA: Diagnosis not present

## 2018-12-11 DIAGNOSIS — Z1211 Encounter for screening for malignant neoplasm of colon: Secondary | ICD-10-CM | POA: Diagnosis not present

## 2018-12-11 DIAGNOSIS — D124 Benign neoplasm of descending colon: Secondary | ICD-10-CM | POA: Diagnosis not present

## 2018-12-13 DIAGNOSIS — Z1211 Encounter for screening for malignant neoplasm of colon: Secondary | ICD-10-CM | POA: Diagnosis not present

## 2018-12-13 DIAGNOSIS — D124 Benign neoplasm of descending colon: Secondary | ICD-10-CM | POA: Diagnosis not present

## 2018-12-20 DIAGNOSIS — H43813 Vitreous degeneration, bilateral: Secondary | ICD-10-CM | POA: Diagnosis not present

## 2018-12-20 DIAGNOSIS — H04123 Dry eye syndrome of bilateral lacrimal glands: Secondary | ICD-10-CM | POA: Diagnosis not present

## 2018-12-20 DIAGNOSIS — H31001 Unspecified chorioretinal scars, right eye: Secondary | ICD-10-CM | POA: Diagnosis not present

## 2018-12-20 DIAGNOSIS — E119 Type 2 diabetes mellitus without complications: Secondary | ICD-10-CM | POA: Diagnosis not present

## 2019-01-04 DIAGNOSIS — R509 Fever, unspecified: Secondary | ICD-10-CM | POA: Diagnosis not present

## 2019-01-04 DIAGNOSIS — J101 Influenza due to other identified influenza virus with other respiratory manifestations: Secondary | ICD-10-CM | POA: Diagnosis not present

## 2019-02-27 DIAGNOSIS — R399 Unspecified symptoms and signs involving the genitourinary system: Secondary | ICD-10-CM | POA: Diagnosis not present

## 2019-03-05 ENCOUNTER — Other Ambulatory Visit: Payer: Self-pay

## 2019-03-05 ENCOUNTER — Other Ambulatory Visit: Payer: Self-pay | Admitting: Family Medicine

## 2019-03-05 ENCOUNTER — Ambulatory Visit
Admission: RE | Admit: 2019-03-05 | Discharge: 2019-03-05 | Disposition: A | Discharge status: Home / Self Care | Payer: PPO | Source: Ambulatory Visit | Attending: Family Medicine | Admitting: Family Medicine

## 2019-03-05 DIAGNOSIS — R109 Unspecified abdominal pain: Secondary | ICD-10-CM | POA: Diagnosis not present

## 2019-03-05 DIAGNOSIS — E1169 Type 2 diabetes mellitus with other specified complication: Secondary | ICD-10-CM | POA: Diagnosis not present

## 2019-03-05 DIAGNOSIS — R399 Unspecified symptoms and signs involving the genitourinary system: Secondary | ICD-10-CM | POA: Diagnosis not present

## 2019-03-21 DIAGNOSIS — E1169 Type 2 diabetes mellitus with other specified complication: Secondary | ICD-10-CM | POA: Diagnosis not present

## 2019-03-21 DIAGNOSIS — I10 Essential (primary) hypertension: Secondary | ICD-10-CM | POA: Diagnosis not present

## 2019-03-21 DIAGNOSIS — Z7984 Long term (current) use of oral hypoglycemic drugs: Secondary | ICD-10-CM | POA: Diagnosis not present

## 2019-03-21 DIAGNOSIS — E782 Mixed hyperlipidemia: Secondary | ICD-10-CM | POA: Diagnosis not present

## 2019-04-03 DIAGNOSIS — N200 Calculus of kidney: Secondary | ICD-10-CM | POA: Diagnosis not present

## 2019-06-05 DIAGNOSIS — I10 Essential (primary) hypertension: Secondary | ICD-10-CM | POA: Diagnosis not present

## 2019-06-05 DIAGNOSIS — E1169 Type 2 diabetes mellitus with other specified complication: Secondary | ICD-10-CM | POA: Diagnosis not present

## 2019-06-05 DIAGNOSIS — C50912 Malignant neoplasm of unspecified site of left female breast: Secondary | ICD-10-CM | POA: Diagnosis not present

## 2019-06-05 DIAGNOSIS — E782 Mixed hyperlipidemia: Secondary | ICD-10-CM | POA: Diagnosis not present

## 2019-06-05 DIAGNOSIS — C50911 Malignant neoplasm of unspecified site of right female breast: Secondary | ICD-10-CM | POA: Diagnosis not present

## 2019-10-17 DIAGNOSIS — I10 Essential (primary) hypertension: Secondary | ICD-10-CM | POA: Diagnosis not present

## 2019-10-17 DIAGNOSIS — Z7984 Long term (current) use of oral hypoglycemic drugs: Secondary | ICD-10-CM | POA: Diagnosis not present

## 2019-10-17 DIAGNOSIS — E1169 Type 2 diabetes mellitus with other specified complication: Secondary | ICD-10-CM | POA: Diagnosis not present

## 2019-10-17 DIAGNOSIS — Z0000 Encounter for general adult medical examination without abnormal findings: Secondary | ICD-10-CM | POA: Diagnosis not present

## 2019-10-17 DIAGNOSIS — Z1389 Encounter for screening for other disorder: Secondary | ICD-10-CM | POA: Diagnosis not present

## 2019-10-17 DIAGNOSIS — E782 Mixed hyperlipidemia: Secondary | ICD-10-CM | POA: Diagnosis not present

## 2020-01-04 DIAGNOSIS — E782 Mixed hyperlipidemia: Secondary | ICD-10-CM | POA: Diagnosis not present

## 2020-01-04 DIAGNOSIS — C50912 Malignant neoplasm of unspecified site of left female breast: Secondary | ICD-10-CM | POA: Diagnosis not present

## 2020-01-04 DIAGNOSIS — I10 Essential (primary) hypertension: Secondary | ICD-10-CM | POA: Diagnosis not present

## 2020-01-04 DIAGNOSIS — Z7984 Long term (current) use of oral hypoglycemic drugs: Secondary | ICD-10-CM | POA: Diagnosis not present

## 2020-01-04 DIAGNOSIS — C50911 Malignant neoplasm of unspecified site of right female breast: Secondary | ICD-10-CM | POA: Diagnosis not present

## 2020-01-04 DIAGNOSIS — E1169 Type 2 diabetes mellitus with other specified complication: Secondary | ICD-10-CM | POA: Diagnosis not present

## 2020-04-16 DIAGNOSIS — E782 Mixed hyperlipidemia: Secondary | ICD-10-CM | POA: Diagnosis not present

## 2020-04-16 DIAGNOSIS — R1031 Right lower quadrant pain: Secondary | ICD-10-CM | POA: Diagnosis not present

## 2020-04-16 DIAGNOSIS — N23 Unspecified renal colic: Secondary | ICD-10-CM | POA: Diagnosis not present

## 2020-04-16 DIAGNOSIS — M19049 Primary osteoarthritis, unspecified hand: Secondary | ICD-10-CM | POA: Diagnosis not present

## 2020-04-16 DIAGNOSIS — E1169 Type 2 diabetes mellitus with other specified complication: Secondary | ICD-10-CM | POA: Diagnosis not present

## 2020-04-16 DIAGNOSIS — Z87898 Personal history of other specified conditions: Secondary | ICD-10-CM | POA: Diagnosis not present

## 2020-04-16 DIAGNOSIS — I10 Essential (primary) hypertension: Secondary | ICD-10-CM | POA: Diagnosis not present

## 2020-05-06 DIAGNOSIS — C50911 Malignant neoplasm of unspecified site of right female breast: Secondary | ICD-10-CM | POA: Diagnosis not present

## 2020-05-06 DIAGNOSIS — C50912 Malignant neoplasm of unspecified site of left female breast: Secondary | ICD-10-CM | POA: Diagnosis not present

## 2020-05-13 DIAGNOSIS — C50911 Malignant neoplasm of unspecified site of right female breast: Secondary | ICD-10-CM | POA: Diagnosis not present

## 2020-05-13 DIAGNOSIS — C50912 Malignant neoplasm of unspecified site of left female breast: Secondary | ICD-10-CM | POA: Diagnosis not present

## 2020-06-05 DIAGNOSIS — C50911 Malignant neoplasm of unspecified site of right female breast: Secondary | ICD-10-CM | POA: Diagnosis not present

## 2020-06-05 DIAGNOSIS — I10 Essential (primary) hypertension: Secondary | ICD-10-CM | POA: Diagnosis not present

## 2020-06-05 DIAGNOSIS — E782 Mixed hyperlipidemia: Secondary | ICD-10-CM | POA: Diagnosis not present

## 2020-06-05 DIAGNOSIS — M19049 Primary osteoarthritis, unspecified hand: Secondary | ICD-10-CM | POA: Diagnosis not present

## 2020-06-05 DIAGNOSIS — C50912 Malignant neoplasm of unspecified site of left female breast: Secondary | ICD-10-CM | POA: Diagnosis not present

## 2020-06-05 DIAGNOSIS — E1169 Type 2 diabetes mellitus with other specified complication: Secondary | ICD-10-CM | POA: Diagnosis not present

## 2020-06-06 IMAGING — CT CT ABDOMEN AND PELVIS WITHOUT CONTRAST
1 of 2 series · 14 of 32 positions shown, 19 images · non-contrast
Comparison: None.

CLINICAL DATA: Left flank pain, bladder pain

EXAM:
CT ABDOMEN AND PELVIS WITHOUT CONTRAST
TECHNIQUE: Multidetector CT imaging of the abdomen and pelvis was performed
following the standard protocol without IV contrast.

[Series 2: renal standard/full · axial · 0.79mm/px · z∈[-503,-48]mm · 14 of 103 slices shown, 19 images]
[im 6/103  soft-tissue]
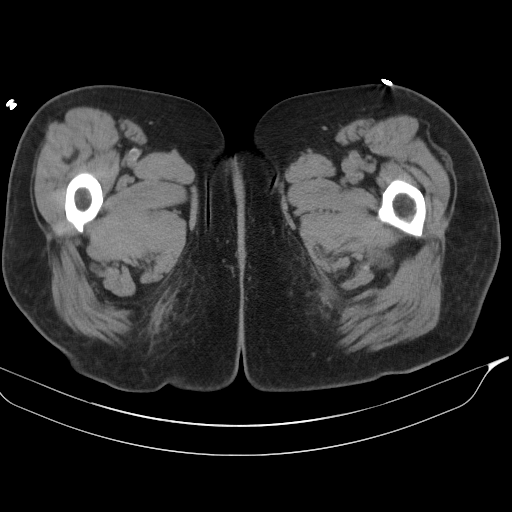
[im 6/103  bone]
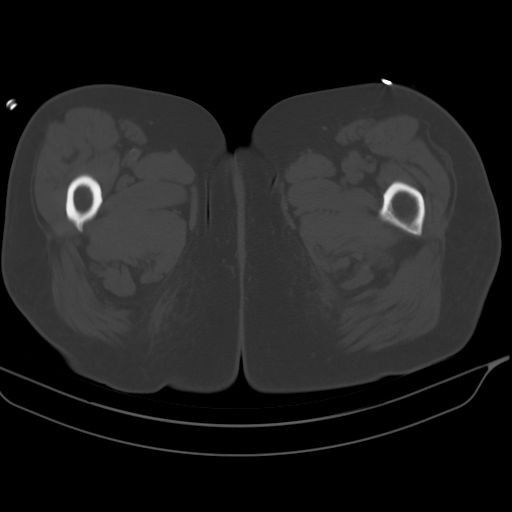
[im 17/103  soft-tissue]
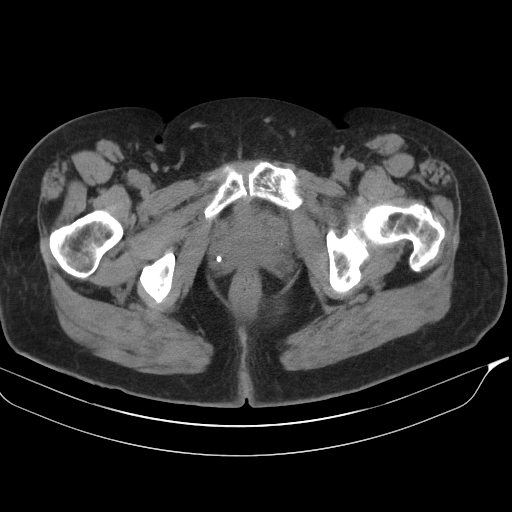
[im 22/103  soft-tissue]
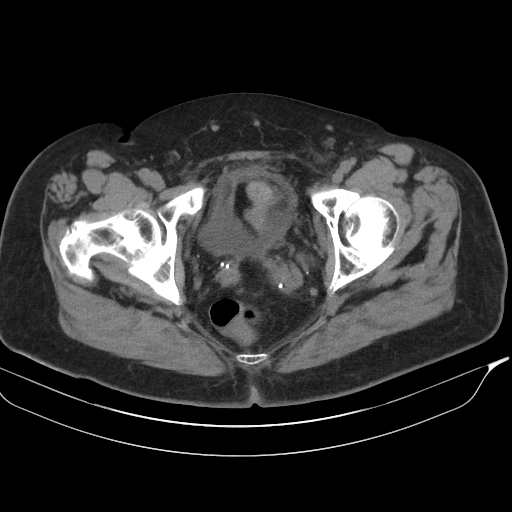
[im 27/103  soft-tissue]
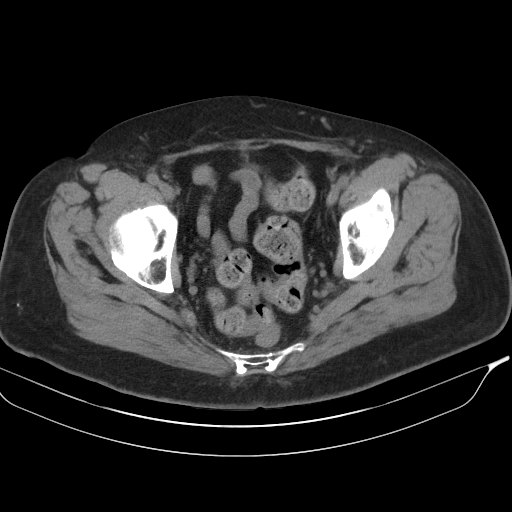
[im 38/103  soft-tissue]
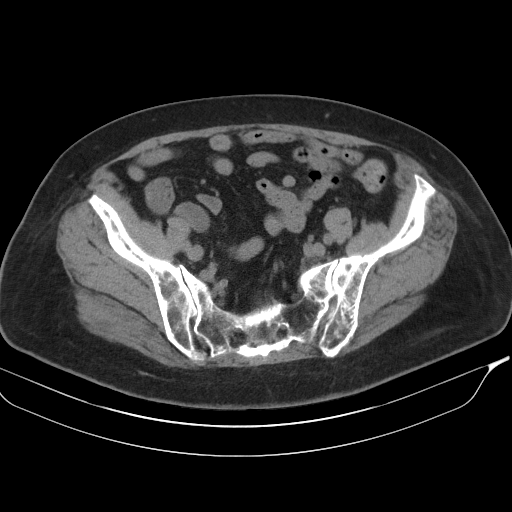
[im 43/103  soft-tissue]
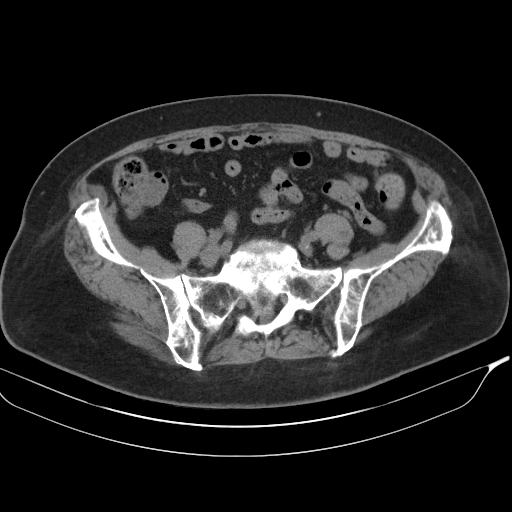
[im 54/103  soft-tissue]
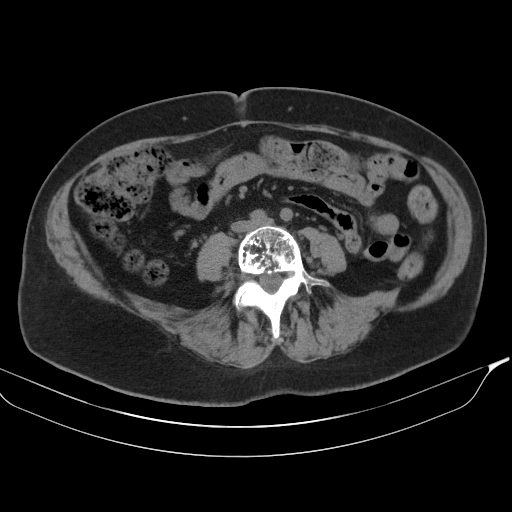
[im 60/103  soft-tissue]
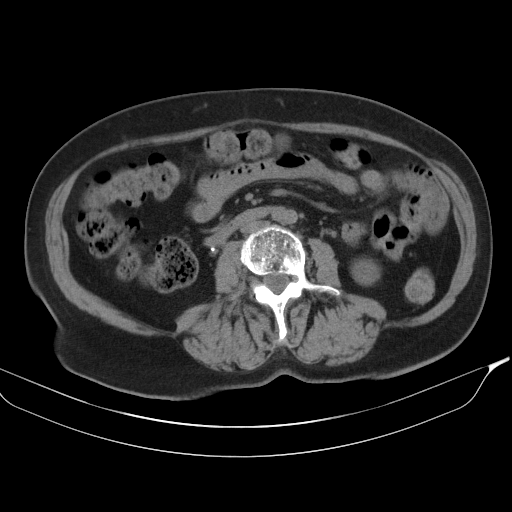
[im 65/103  soft-tissue]
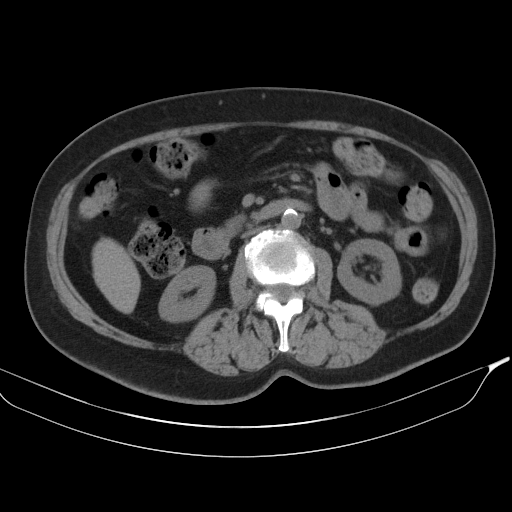
[im 65/103  bone]
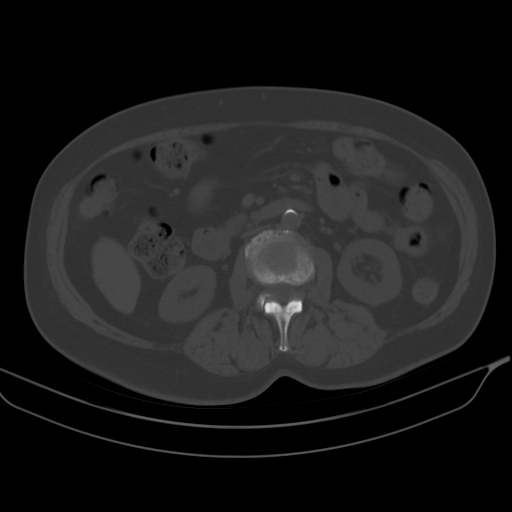
[im 76/103  soft-tissue]
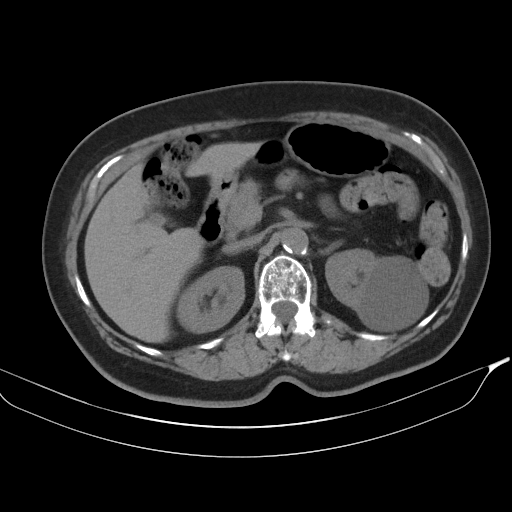
[im 81/103  soft-tissue]
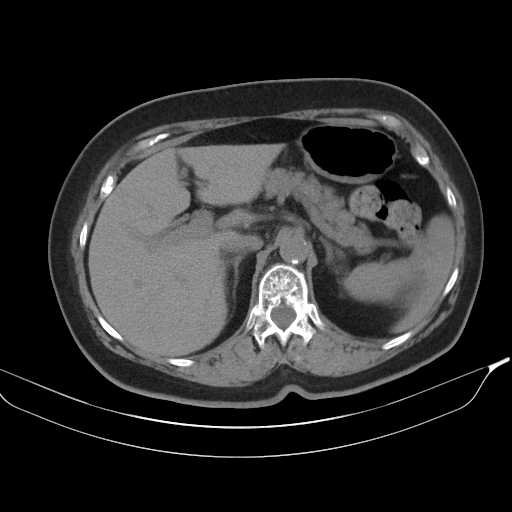
[im 81/103  lung]
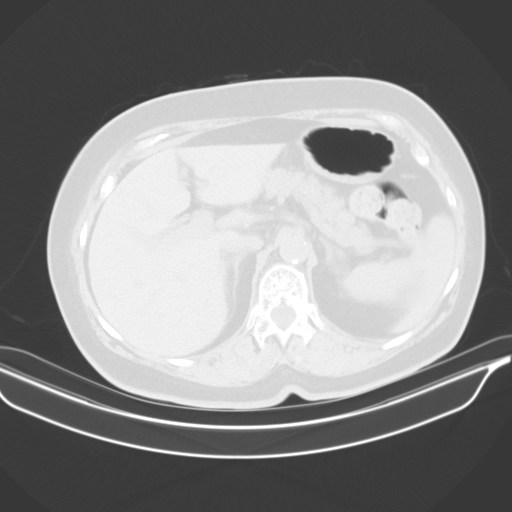
[im 86/103  soft-tissue]
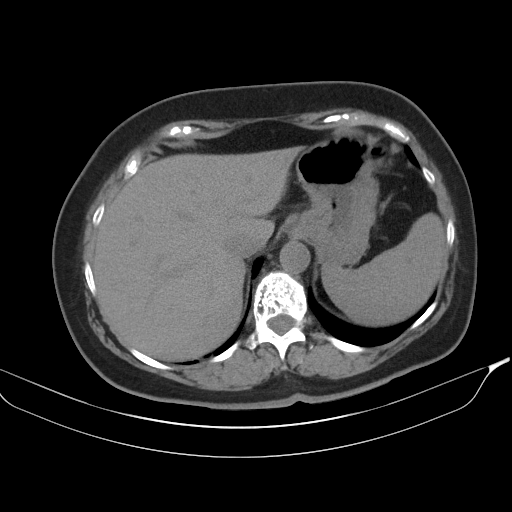
[im 86/103  lung]
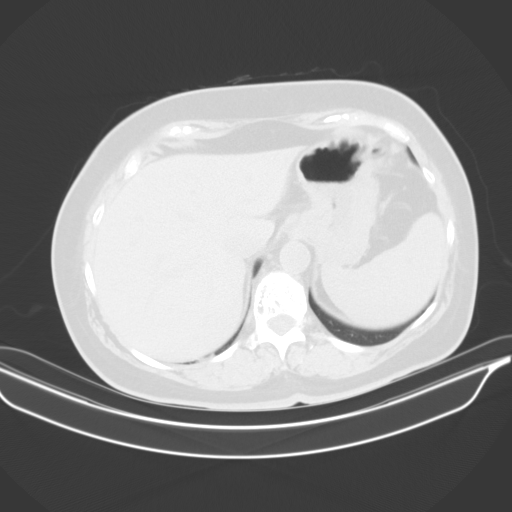
[im 92/103  lung]
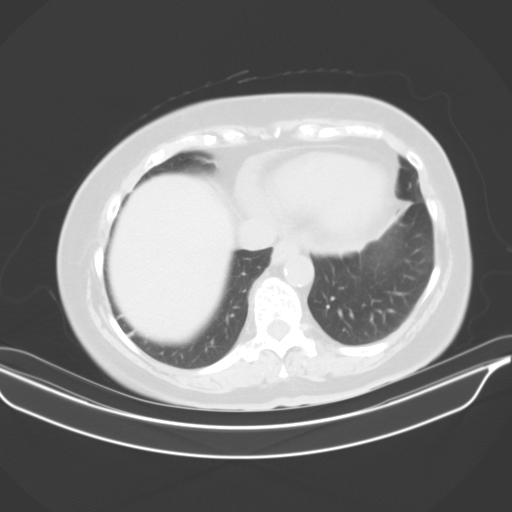
[im 97/103  soft-tissue]
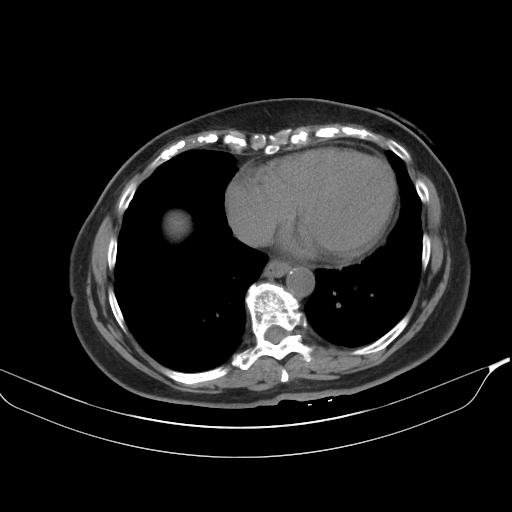
[im 97/103  lung]
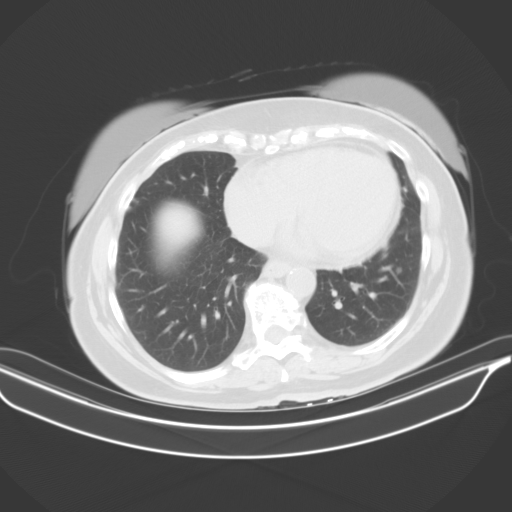

[14 of 32 positions shown; findings below may reference images not displayed]

FINDINGS: Lower chest: No acute abnormality.

Hepatobiliary: No focal liver abnormality is seen. No gallstones,
gallbladder wall thickening, or biliary dilatation.

Pancreas: Unremarkable. No pancreatic ductal dilatation or
surrounding inflammatory changes.

Spleen: Normal in size without focal abnormality.

Adrenals/Urinary Tract: Normal adrenal glands. 6.3 cm hypodense,
fluid attenuating left renal mass consistent with a cyst. 2 mm
distal left ureteral calculus without obstructive uropathy.
Decompressed bladder.

Stomach/Bowel: Stomach is within normal limits. Appendix appears
normal. No evidence of bowel wall thickening, distention, or
inflammatory changes.

Vascular/Lymphatic: Normal caliber abdominal aorta with mild
atherosclerosis. No lymphadenopathy.

Reproductive: Status post hysterectomy. No adnexal masses.

Other: Small fat containing umbilical hernia.  No ascites.

Musculoskeletal: No acute osseous abnormality. No aggressive osseous
lesion. Degenerative disc disease with disc height loss at L4-5 and
L5-S1.
IMPRESSION: 1.  2 mm distal left ureteral calculus without obstructive uropathy.

## 2020-10-20 DIAGNOSIS — I10 Essential (primary) hypertension: Secondary | ICD-10-CM | POA: Diagnosis not present

## 2020-10-20 DIAGNOSIS — Z0000 Encounter for general adult medical examination without abnormal findings: Secondary | ICD-10-CM | POA: Diagnosis not present

## 2020-10-20 DIAGNOSIS — Z1389 Encounter for screening for other disorder: Secondary | ICD-10-CM | POA: Diagnosis not present

## 2020-10-20 DIAGNOSIS — Z7984 Long term (current) use of oral hypoglycemic drugs: Secondary | ICD-10-CM | POA: Diagnosis not present

## 2020-10-20 DIAGNOSIS — E1169 Type 2 diabetes mellitus with other specified complication: Secondary | ICD-10-CM | POA: Diagnosis not present

## 2020-10-20 DIAGNOSIS — E782 Mixed hyperlipidemia: Secondary | ICD-10-CM | POA: Diagnosis not present

## 2020-10-21 DIAGNOSIS — C50911 Malignant neoplasm of unspecified site of right female breast: Secondary | ICD-10-CM | POA: Diagnosis not present

## 2020-10-21 DIAGNOSIS — C50912 Malignant neoplasm of unspecified site of left female breast: Secondary | ICD-10-CM | POA: Diagnosis not present

## 2020-10-21 DIAGNOSIS — E782 Mixed hyperlipidemia: Secondary | ICD-10-CM | POA: Diagnosis not present

## 2020-10-21 DIAGNOSIS — E1169 Type 2 diabetes mellitus with other specified complication: Secondary | ICD-10-CM | POA: Diagnosis not present

## 2020-10-21 DIAGNOSIS — M19049 Primary osteoarthritis, unspecified hand: Secondary | ICD-10-CM | POA: Diagnosis not present

## 2020-10-21 DIAGNOSIS — G4700 Insomnia, unspecified: Secondary | ICD-10-CM | POA: Diagnosis not present

## 2020-10-21 DIAGNOSIS — I10 Essential (primary) hypertension: Secondary | ICD-10-CM | POA: Diagnosis not present

## 2021-02-24 DIAGNOSIS — W57XXXA Bitten or stung by nonvenomous insect and other nonvenomous arthropods, initial encounter: Secondary | ICD-10-CM | POA: Diagnosis not present

## 2021-02-24 DIAGNOSIS — R21 Rash and other nonspecific skin eruption: Secondary | ICD-10-CM | POA: Diagnosis not present

## 2021-04-22 DIAGNOSIS — E1169 Type 2 diabetes mellitus with other specified complication: Secondary | ICD-10-CM | POA: Diagnosis not present

## 2021-04-22 DIAGNOSIS — E782 Mixed hyperlipidemia: Secondary | ICD-10-CM | POA: Diagnosis not present

## 2021-04-22 DIAGNOSIS — Z7984 Long term (current) use of oral hypoglycemic drugs: Secondary | ICD-10-CM | POA: Diagnosis not present

## 2021-04-22 DIAGNOSIS — I10 Essential (primary) hypertension: Secondary | ICD-10-CM | POA: Diagnosis not present

## 2021-05-04 DIAGNOSIS — J069 Acute upper respiratory infection, unspecified: Secondary | ICD-10-CM | POA: Diagnosis not present

## 2021-06-03 DIAGNOSIS — J329 Chronic sinusitis, unspecified: Secondary | ICD-10-CM | POA: Diagnosis not present

## 2021-06-03 DIAGNOSIS — J3489 Other specified disorders of nose and nasal sinuses: Secondary | ICD-10-CM | POA: Diagnosis not present

## 2021-06-03 DIAGNOSIS — R509 Fever, unspecified: Secondary | ICD-10-CM | POA: Diagnosis not present

## 2021-06-03 DIAGNOSIS — R6883 Chills (without fever): Secondary | ICD-10-CM | POA: Diagnosis not present

## 2021-06-03 DIAGNOSIS — R059 Cough, unspecified: Secondary | ICD-10-CM | POA: Diagnosis not present

## 2021-06-03 DIAGNOSIS — J029 Acute pharyngitis, unspecified: Secondary | ICD-10-CM | POA: Diagnosis not present

## 2021-11-05 DIAGNOSIS — I10 Essential (primary) hypertension: Secondary | ICD-10-CM | POA: Diagnosis not present

## 2021-11-05 DIAGNOSIS — Z7984 Long term (current) use of oral hypoglycemic drugs: Secondary | ICD-10-CM | POA: Diagnosis not present

## 2021-11-05 DIAGNOSIS — Z0000 Encounter for general adult medical examination without abnormal findings: Secondary | ICD-10-CM | POA: Diagnosis not present

## 2021-11-05 DIAGNOSIS — Z1159 Encounter for screening for other viral diseases: Secondary | ICD-10-CM | POA: Diagnosis not present

## 2021-11-05 DIAGNOSIS — Z1389 Encounter for screening for other disorder: Secondary | ICD-10-CM | POA: Diagnosis not present

## 2021-11-05 DIAGNOSIS — E782 Mixed hyperlipidemia: Secondary | ICD-10-CM | POA: Diagnosis not present

## 2021-11-05 DIAGNOSIS — E1169 Type 2 diabetes mellitus with other specified complication: Secondary | ICD-10-CM | POA: Diagnosis not present

## 2022-05-06 DIAGNOSIS — E1169 Type 2 diabetes mellitus with other specified complication: Secondary | ICD-10-CM | POA: Diagnosis not present

## 2022-05-06 DIAGNOSIS — R011 Cardiac murmur, unspecified: Secondary | ICD-10-CM | POA: Diagnosis not present

## 2022-05-06 DIAGNOSIS — Z7984 Long term (current) use of oral hypoglycemic drugs: Secondary | ICD-10-CM | POA: Diagnosis not present

## 2022-05-06 DIAGNOSIS — I10 Essential (primary) hypertension: Secondary | ICD-10-CM | POA: Diagnosis not present

## 2022-05-06 DIAGNOSIS — R079 Chest pain, unspecified: Secondary | ICD-10-CM | POA: Diagnosis not present

## 2022-05-06 DIAGNOSIS — E782 Mixed hyperlipidemia: Secondary | ICD-10-CM | POA: Diagnosis not present

## 2022-05-31 ENCOUNTER — Encounter: Payer: Self-pay | Admitting: *Deleted

## 2022-05-31 ENCOUNTER — Encounter: Payer: Self-pay | Admitting: Cardiology

## 2022-05-31 ENCOUNTER — Ambulatory Visit: Payer: PPO | Admitting: Cardiology

## 2022-05-31 VITALS — BP 120/70 | HR 68 | Ht 67.0 in | Wt 159.0 lb

## 2022-05-31 DIAGNOSIS — E7800 Pure hypercholesterolemia, unspecified: Secondary | ICD-10-CM

## 2022-05-31 DIAGNOSIS — R011 Cardiac murmur, unspecified: Secondary | ICD-10-CM

## 2022-05-31 DIAGNOSIS — E119 Type 2 diabetes mellitus without complications: Secondary | ICD-10-CM | POA: Diagnosis not present

## 2022-05-31 DIAGNOSIS — R072 Precordial pain: Secondary | ICD-10-CM | POA: Diagnosis not present

## 2022-05-31 DIAGNOSIS — I10 Essential (primary) hypertension: Secondary | ICD-10-CM

## 2022-05-31 DIAGNOSIS — I491 Atrial premature depolarization: Secondary | ICD-10-CM | POA: Diagnosis not present

## 2022-05-31 NOTE — Progress Notes (Signed)
Cardiology Office Note:    Date:  05/31/2022   ID:  Lisa Bradshaw, DOB 08/15/1946, MRN [id number]  PCP:  Carol Ada, MD   [facility] Providers Cardiologist:  Candee Furbish, MD     Referring MD: Carol Ada, MD    History of Present Illness:    Lisa Bradshaw is a 76 y.o. female here for the evaluation of atypical chest pain and cardiac murmur.  Occasionally will have chest heaviness.  She was referred by Dr. Carol Ada for evaluation.  She has had hypertension, hyperlipidemia stable on Vytorin  Chest heaviness, thinks its from getting older. If stands for awhile feels it. Dishes, cooking, quiliting. Sits it goes away. Feels a flutter sometimes when laying down.   Walks about 3000 steps.   No syncope, no SOB. Feels lightheaded when getting up. Sometimes BP can be low.   She also has diabetes with prior hemoglobin A1c of 6.7.  Been on metformin.  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 > 20 yr.   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 breast [facility])    Mass of finger of right hand 02/06/2014   thumb   Mild concentric left ventricular hypertrophy    Mitral valve regurgitation    Non-insulin dependent type 2 diabetes mellitus ([place])    Osteoarthritis of hand    Pericardial effusion (noninflammatory) 11/08/2000   history of  - no longer sees cardiologist   Seasonal allergies     Past Surgical History:  Procedure Laterality Date   ABDOMINAL  HYSTERECTOMY  1992   complete   CESAREAN SECTION  1969   EXCISION METACARPAL MASS Right 02/25/2014   Procedure: EXCISION MASS RIGHT THUMB  ;  Surgeon: Tennis Must, MD;  Location: [place];  Service: Orthopedics;  Laterality: Right;   FOOT NEUROMA SURGERY Left 2006   MASTECTOMY Left 04/03/2004   TOTAL MASTECTOMY Right 09/22/2006   with ax. node bx.   TRANSESOPHAGEAL ECHOCARDIOGRAM  08/23/2001    Current Medications: Current Meds  Medication Sig   aspirin 81 MG tablet Take 81 mg by mouth daily.   ezetimibe-simvastatin (VYTORIN) 10-40 MG per tablet Take 1 tablet by mouth daily.   lisinopril-hydrochlorothiazide (PRINZIDE,ZESTORETIC) 10-12.5 MG per tablet Take 1 tablet by mouth daily.   loratadine (CLARITIN) 10 MG tablet Take 10 mg by mouth daily as needed for allergies.    metFORMIN (GLUCOPHAGE) 500 MG tablet Take 500 mg by mouth 2 (two) times daily with a meal.   Multiple Vitamin (MULTIVITAMIN) tablet Take 1 tablet by mouth daily.     Allergies:   Patient has no known allergies.   Social History   Socioeconomic History   Marital status: Widowed    Spouse name: Not on file   Number of children: Not on file  Years of education: Not on file   Highest education level: Not on file  Occupational History   Not on file  Tobacco Use   Smoking status: Never   Smokeless tobacco: Never  Substance and Sexual Activity   Alcohol use: No   Drug use: No   Sexual activity: Not on file  Other Topics Concern   Not on file  Social History Narrative   Not on file   Social Determinants of Health   Financial Resource Strain: Not on file  Food Insecurity: Not on file  Transportation Needs: Not on file  Physical Activity: Not on file  Stress: Not on file  Social Connections: Not on file     Family History: The patient's family history includes CAD in her father; Cancer - Other in her father and mother; Stroke in her father.  ROS:   Please see the history of present  illness.     All other systems reviewed and are negative.  EKGs/Labs/Other Studies Reviewed:    The following studies were reviewed today: Prior echocardiogram many years ago showed a pericardial effusion.  EKG:  EKG is  ordered today.  The ekg ordered today demonstrates sinus rhythm 68 PAC  Recent Labs: No results found for requested labs within last 365 days.  Recent Lipid Panel No results found for: "CHOL", "TRIG", "HDL", "CHOLHDL", "VLDL", "LDLCALC", "LDLDIRECT"   Risk Assessment/Calculations:              Physical Exam:    VS:  BP 120/70 (BP Location: Left Arm, Patient Position: Sitting, Cuff Size: Normal)   Pulse 68   Ht '5\' 7"'$  (1.702 m)   Wt 159 lb (72.1 kg)   SpO2 95%   BMI 24.90 kg/m     Wt Readings from Last 3 Encounters:  05/31/22 159 lb (72.1 kg)  11/06/15 175 lb (79.4 kg)  02/25/14 177 lb (80.3 kg)     GEN:  Well nourished, well developed in no acute distress HEENT: Normal NECK: No JVD; No carotid bruits LYMPHATICS: No lymphadenopathy CARDIAC: RRR, no murmurs, no rubs, gallops RESPIRATORY:  Clear to auscultation without rales, wheezing or rhonchi  ABDOMEN: Soft, non-tender, non-distended MUSCULOSKELETAL:  No edema; No deformity  SKIN: Warm and dry NEUROLOGIC:  Alert and oriented x 3 PSYCHIATRIC:  Normal affect   ASSESSMENT:    1. Murmur, cardiac   2. Precordial pain   3. Diabetes mellitus with coincident hypertension ([place])   4. Pure hypercholesterolemia   5. Premature atrial contractions    PLAN:    In order of problems listed above:  Chest heaviness - We will go ahead and check a pharmacologic stress test to ensure that she does not have any high degree of ischemia present.  Heart murmur - Systolic murmur noted.  We will check an echocardiogram. -No high risk symptoms such as syncope or significant shortness of breath.  Premature atrial contractions - Noted on ECG.  Likely the symptoms of flutter are from PACs.  Benign.  History of  pericardial effusion - Several years ago had echocardiogram with pericardial effusion.  Echocardiogram will be ordered.  If a small effusion is present, likely chronic.  Hypertension with diabetes - On low-dose 10/12.5 of lisinopril hydrochlorothiazide.  Occasionally she will feel some lightheadedness when getting up.  If this recurs or if blood pressure gets too low, she may benefit from lisinopril only.  Hyperlipidemia - Continue with Vytorin 10/40.  Excellent.  Last LDL 85 from outside labs, hemoglobin A1c 6.4  hemoglobin 13.9 ALT 18    Shared Decision Making/Informed Consent The risks [chest pain, shortness of breath, cardiac arrhythmias, dizziness, blood pressure fluctuations, myocardial infarction, stroke/transient ischemic attack, nausea, vomiting, allergic reaction, radiation exposure, metallic taste sensation and life-threatening complications (estimated to be 1 in 10,000)], benefits (risk stratification, diagnosing coronary artery disease, treatment guidance) and alternatives of a nuclear stress test were discussed in detail with Lisa Bradshaw and she agrees to proceed.    Medication Adjustments/Labs and Tests Ordered: Current medicines are reviewed at length with the patient today.  Concerns regarding medicines are outlined above.  Orders Placed This Encounter  Procedures   MYOCARDIAL PERFUSION IMAGING   EKG 12-Lead   ECHOCARDIOGRAM COMPLETE   No orders of the defined types were placed in this encounter.   Patient Instructions  Medication Instructions:  The current medical regimen is effective;  continue present plan and medications.  *If you need a refill on your cardiac medications before your next appointment, please call your pharmacy*  Testing/Procedures: Your physician has requested that you have an echocardiogram. Echocardiography is a painless test that uses sound waves to create images of your heart. It provides your doctor with information about the size and shape of  your heart and how well your heart's chambers and valves are working. This procedure takes approximately one hour. There are no restrictions for this procedure.  Your physician has requested that you have a lexiscan myoview. For further information please visit [email or web address]. Please follow instruction sheet, as given.  Follow-Up: At [facility], you and your health needs are our priority.  As part of our continuing mission to provide you with exceptional heart care, we have created designated Provider Care Teams.  These Care Teams include your primary Cardiologist (physician) and Advanced Practice Providers (APPs -  Physician Assistants and Nurse Practitioners) who all work together to provide you with the care you need, when you need it.  We recommend signing up for the patient portal called "MyChart".  Sign up information is provided on this After Visit Summary.  MyChart is used to connect with patients for Virtual Visits (Telemedicine).  Patients are able to view lab/test results, encounter notes, upcoming appointments, etc.  Non-urgent messages can be sent to your provider as well.   To learn more about what you can do with MyChart, go to [email or web address].    Your next appointment:    6 month(s)  The format for your next appointment:   In Person  Provider:   Candee Furbish, MD {   Important Information About Sugar         Signed, Candee Furbish, MD  05/31/2022 11:14 AM    [place]

## 2022-05-31 NOTE — Patient Instructions (Signed)
Medication Instructions:  The current medical regimen is effective;  continue present plan and medications.  *If you need a refill on your cardiac medications before your next appointment, please call your pharmacy*  Testing/Procedures: Your physician has requested that you have an echocardiogram. Echocardiography is a painless test that uses sound waves to create images of your heart. It provides your doctor with information about the size and shape of your heart and how well your heart's chambers and valves are working. This procedure takes approximately one hour. There are no restrictions for this procedure.  Your physician has requested that you have a lexiscan myoview. For further information please visit [email or web address]. Please follow instruction sheet, as given.  Follow-Up: At [facility], you and your health needs are our priority.  As part of our continuing mission to provide you with exceptional heart care, we have created designated Provider Care Teams.  These Care Teams include your primary Cardiologist (physician) and Advanced Practice Providers (APPs -  Physician Assistants and Nurse Practitioners) who all work together to provide you with the care you need, when you need it.  We recommend signing up for the patient portal called "MyChart".  Sign up information is provided on this After Visit Summary.  MyChart is used to connect with patients for Virtual Visits (Telemedicine).  Patients are able to view lab/test results, encounter notes, upcoming appointments, etc.  Non-urgent messages can be sent to your provider as well.   To learn more about what you can do with MyChart, go to [email or web address].    Your next appointment:    6 month(s)  The format for your next appointment:   In Person  Provider:   Candee Furbish, MD {   Important Information About Sugar

## 2022-06-11 ENCOUNTER — Telehealth (HOSPITAL_COMMUNITY): Payer: Self-pay | Admitting: Radiology

## 2022-06-11 NOTE — Telephone Encounter (Signed)
Patient given detailed instructions per Myocardial Perfusion Study Information Sheet for the test on 8/9 at 8:00. Patient notified to arrive 15 minutes early and that it is imperative to arrive on time for appointment to keep from having the test rescheduled.  If you need to cancel or reschedule your appointment, please call the office within 24 hours of your appointment. . Patient verbalized understanding.EHK

## 2022-06-16 ENCOUNTER — Ambulatory Visit (HOSPITAL_BASED_OUTPATIENT_CLINIC_OR_DEPARTMENT_OTHER): Payer: PPO

## 2022-06-16 ENCOUNTER — Ambulatory Visit (HOSPITAL_COMMUNITY): Payer: PPO | Attending: Cardiology

## 2022-06-16 DIAGNOSIS — R011 Cardiac murmur, unspecified: Secondary | ICD-10-CM | POA: Diagnosis not present

## 2022-06-16 DIAGNOSIS — R072 Precordial pain: Secondary | ICD-10-CM | POA: Diagnosis not present

## 2022-06-16 LAB — ECHOCARDIOGRAM COMPLETE
AR max vel: 1.34 cm2
AV Area VTI: 1.22 cm2
AV Area mean vel: 1.22 cm2
AV Mean grad: 11 mmHg
AV Peak grad: 19.2 mmHg
Ao pk vel: 2.19 m/s
Area-P 1/2: 4.14 cm2
Height: 67 in
P 1/2 time: 490 msec
S' Lateral: 2.3 cm
Weight: 2544 oz

## 2022-06-16 LAB — MYOCARDIAL PERFUSION IMAGING
LV dias vol: 61 mL (ref 46–106)
LV sys vol: 23 mL
Nuc Stress EF: 62 %
Peak HR: 86 {beats}/min
Rest HR: 67 {beats}/min
Rest Nuclear Isotope Dose: 10.8 mCi
SDS: 3
SRS: 0
SSS: 3
ST Depression (mm): 0 mm
Stress Nuclear Isotope Dose: 32.6 mCi
TID: 0.86

## 2022-06-16 MED ORDER — TECHNETIUM TC 99M TETROFOSMIN IV KIT
32.6000 | PACK | Freq: Once | INTRAVENOUS | Status: AC | PRN
  Administered 2022-06-16: 32.6 via INTRAVENOUS

## 2022-06-16 MED ORDER — TECHNETIUM TC 99M TETROFOSMIN IV KIT
10.8000 | PACK | Freq: Once | INTRAVENOUS | Status: AC | PRN
  Administered 2022-06-16: 10.8 via INTRAVENOUS

## 2022-06-16 MED ORDER — REGADENOSON 0.4 MG/5ML IV SOLN
0.4000 mg | Freq: Once | INTRAVENOUS | Status: AC
  Administered 2022-06-16: 0.4 mg via INTRAVENOUS

## 2022-06-18 ENCOUNTER — Encounter: Payer: Self-pay | Admitting: Cardiology

## 2022-06-18 DIAGNOSIS — R011 Cardiac murmur, unspecified: Secondary | ICD-10-CM

## 2022-06-22 ENCOUNTER — Telehealth: Payer: Self-pay | Admitting: Cardiology

## 2022-06-22 NOTE — Telephone Encounter (Signed)
Please see the MyChart message reply(ies) for my assessment and plan.    This patient gave consent for this Medical Advice Message and is aware that it may result in a bill to their insurance company, as well as the possibility of receiving a bill for a co-payment or deductible. They are an established patient, but are not seeking medical advice exclusively about a problem treated during an in person or video visit in the last seven days. I did not recommend an in person or video visit within seven days of my reply.    I spent a total of 11 minutes cumulative time within 7 days through MyChart messaging.  Nasiah Polinsky, MD   

## 2022-06-22 NOTE — Telephone Encounter (Signed)
Lexiscan - Overall reassuring stress test, low risk.  No evidence of ischemia or infarction. Reassurance. Candee Furbish, MD   ECHO-  Jerline Pain, MD  06/18/2022  5:53 AM EDT     EF 92%-TGRMB 2 diastolic dysfunction, there is some stiffness to the heart upon relaxation. Normal mild mitral regurgitation Mild aortic valve stenosis with mean gradient of 11 mmHg.-This is source of murmur Small pericardial effusion-continue to treat conservatively   We will continue to monitor clinically the aortic stenosis.  No need for further intervention at this time.   Candee Furbish, MD

## 2022-06-22 NOTE — Telephone Encounter (Signed)
Patient called to follow-up on Echo and Stress test results.

## 2022-06-23 NOTE — Telephone Encounter (Signed)
Spoke with patient and reviewed results in detail.  All questions, if any were answered at the time of the call.  Pt is going to follow up with her PCP and requests results be sent to her.  Pt will c/b with any further questions or concerns.

## 2022-08-20 DIAGNOSIS — H5213 Myopia, bilateral: Secondary | ICD-10-CM | POA: Diagnosis not present

## 2022-08-20 DIAGNOSIS — E119 Type 2 diabetes mellitus without complications: Secondary | ICD-10-CM | POA: Diagnosis not present

## 2022-08-20 DIAGNOSIS — H2513 Age-related nuclear cataract, bilateral: Secondary | ICD-10-CM | POA: Diagnosis not present

## 2022-08-20 DIAGNOSIS — H52203 Unspecified astigmatism, bilateral: Secondary | ICD-10-CM | POA: Diagnosis not present

## 2022-09-07 DIAGNOSIS — L570 Actinic keratosis: Secondary | ICD-10-CM | POA: Diagnosis not present

## 2022-09-07 DIAGNOSIS — D485 Neoplasm of uncertain behavior of skin: Secondary | ICD-10-CM | POA: Diagnosis not present

## 2022-09-07 DIAGNOSIS — D3611 Benign neoplasm of peripheral nerves and autonomic nervous system of face, head, and neck: Secondary | ICD-10-CM | POA: Diagnosis not present

## 2022-09-07 DIAGNOSIS — L814 Other melanin hyperpigmentation: Secondary | ICD-10-CM | POA: Diagnosis not present

## 2022-09-20 DIAGNOSIS — L570 Actinic keratosis: Secondary | ICD-10-CM | POA: Diagnosis not present

## 2022-12-08 DIAGNOSIS — Z1331 Encounter for screening for depression: Secondary | ICD-10-CM | POA: Diagnosis not present

## 2022-12-08 DIAGNOSIS — K5901 Slow transit constipation: Secondary | ICD-10-CM | POA: Diagnosis not present

## 2022-12-08 DIAGNOSIS — E782 Mixed hyperlipidemia: Secondary | ICD-10-CM | POA: Diagnosis not present

## 2022-12-08 DIAGNOSIS — Z0000 Encounter for general adult medical examination without abnormal findings: Secondary | ICD-10-CM | POA: Diagnosis not present

## 2022-12-08 DIAGNOSIS — I10 Essential (primary) hypertension: Secondary | ICD-10-CM | POA: Diagnosis not present

## 2022-12-08 DIAGNOSIS — I350 Nonrheumatic aortic (valve) stenosis: Secondary | ICD-10-CM | POA: Diagnosis not present

## 2022-12-08 DIAGNOSIS — E1165 Type 2 diabetes mellitus with hyperglycemia: Secondary | ICD-10-CM | POA: Diagnosis not present

## 2022-12-08 DIAGNOSIS — R1031 Right lower quadrant pain: Secondary | ICD-10-CM | POA: Diagnosis not present

## 2022-12-15 ENCOUNTER — Encounter: Payer: Self-pay | Admitting: Cardiology

## 2022-12-15 ENCOUNTER — Ambulatory Visit: Payer: PPO | Attending: Cardiology | Admitting: Cardiology

## 2022-12-15 VITALS — BP 138/80 | HR 64 | Ht 67.0 in | Wt 156.0 lb

## 2022-12-15 DIAGNOSIS — E119 Type 2 diabetes mellitus without complications: Secondary | ICD-10-CM

## 2022-12-15 DIAGNOSIS — E7800 Pure hypercholesterolemia, unspecified: Secondary | ICD-10-CM

## 2022-12-15 DIAGNOSIS — I350 Nonrheumatic aortic (valve) stenosis: Secondary | ICD-10-CM

## 2022-12-15 DIAGNOSIS — I10 Essential (primary) hypertension: Secondary | ICD-10-CM

## 2022-12-15 NOTE — Patient Instructions (Signed)
Medication Instructions:  The current medical regimen is effective;  continue present plan and medications.  *If you need a refill on your cardiac medications before your next appointment, please call your pharmacy*  Follow-Up: At [facility], you and your health needs are our priority.  As part of our continuing mission to provide you with exceptional heart care, we have created designated Provider Care Teams.  These Care Teams include your primary Cardiologist (physician) and Advanced Practice Providers (APPs -  Physician Assistants and Nurse Practitioners) who all work together to provide you with the care you need, when you need it.  We recommend signing up for the patient portal called "MyChart".  Sign up information is provided on this After Visit Summary.  MyChart is used to connect with patients for Virtual Visits (Telemedicine).  Patients are able to view lab/test results, encounter notes, upcoming appointments, etc.  Non-urgent messages can be sent to your provider as well.   To learn more about what you can do with MyChart, go to [email or web address].    Your next appointment:   1 year(s)  Provider:   Mark Skains, MD      

## 2022-12-15 NOTE — Progress Notes (Signed)
Cardiology Office Note:    Date:  12/15/2022   ID:  Lisa Bradshaw, DOB 07/13/1946, MRN [id number]  PCP:  Carol Ada, MD   [facility] Providers Cardiologist:  Candee Furbish, MD     Referring MD: Carol Ada, MD    History of Present Illness:    Lisa Bradshaw is a 77 y.o. female here for the follow up of atypical chest pain and mild aortic stenosis she.  Occasionally will have chest heaviness.  She was referred by Dr. Carol Ada for evaluation.  She has had hypertension, hyperlipidemia stable on Vytorin  Chest heaviness, thinks its from getting older. If stands for awhile feels it. Dishes, cooking, quiliting. Sits it goes away. Feels a flutter sometimes when laying down.  NUC stress 2023 was low risk  During the summertime tries to walk between 5000 and 10,000 steps.  She has been decreasing this over the winter.  Continue to encourage movement.  Eats plenty of fruits and vegetables.  No syncope, no SOB. Feels lightheaded when getting up. Sometimes BP can be low.  Has not been a recent issue however.  She also has diabetes with prior hemoglobin A1c of 6.7.  Been on metformin.  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 > 20 yr.   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 breast [facility])    Mass of finger of right hand 02/06/2014   thumb   Mild concentric left ventricular hypertrophy    Mitral valve regurgitation    Non-insulin dependent type 2 diabetes  mellitus ([place])    Osteoarthritis of hand    Pericardial effusion (noninflammatory) 11/08/2000   history of  - no longer sees cardiologist   Seasonal allergies     Past Surgical History:  Procedure Laterality Date   ABDOMINAL HYSTERECTOMY  1992   complete   CESAREAN SECTION  1969   EXCISION METACARPAL MASS Right 02/25/2014   Procedure: EXCISION MASS RIGHT THUMB  ;  Surgeon: Tennis Must, MD;  Location: [place];  Service: Orthopedics;  Laterality: Right;   FOOT NEUROMA SURGERY Left 2006   MASTECTOMY Left 04/03/2004   TOTAL MASTECTOMY Right 09/22/2006   with ax. node bx.   TRANSESOPHAGEAL ECHOCARDIOGRAM  08/23/2001    Current Medications: Current Meds  Medication Sig   aspirin 81 MG tablet Take 81 mg by mouth daily.   ezetimibe-simvastatin (VYTORIN) 10-40 MG per tablet Take 1 tablet by mouth daily.   lisinopril-hydrochlorothiazide (PRINZIDE,ZESTORETIC) 10-12.5 MG per tablet Take 1 tablet by mouth daily.   loratadine (CLARITIN) 10 MG tablet Take 10 mg by mouth daily as needed for allergies.    metFORMIN (GLUCOPHAGE) 500 MG tablet Take 500 mg by mouth 2 (two) times daily with a meal.   Multiple Vitamin (MULTIVITAMIN) tablet Take 1 tablet by mouth daily.  Allergies:   Patient has no known allergies.   Social History   Socioeconomic History   Marital status: Widowed    Spouse name: Not on file   Number of children: Not on file   Years of education: Not on file   Highest education level: Not on file  Occupational History   Not on file  Tobacco Use   Smoking status: Never   Smokeless tobacco: Never  Substance and Sexual Activity   Alcohol use: No   Drug use: No   Sexual activity: Not on file  Other Topics Concern   Not on file  Social History Narrative   Not on file   Social Determinants of Health   Financial Resource Strain: Not on file  Food Insecurity: Not on file  Transportation Needs: Not on file  Physical Activity: Not on file  Stress:  Not on file  Social Connections: Not on file     Family History: The patient's family history includes CAD in her father; Cancer - Other in her father and mother; Stroke in her father.  ROS:   Please see the history of present illness.     All other systems reviewed and are negative.  EKGs/Labs/Other Studies Reviewed:    The following studies were reviewed today: Prior echocardiogram many years ago showed a pericardial effusion.  NUC stress 2023 was low risk  06/16/2022 ECHO EF 88%-BVQXI 2 diastolic dysfunction, there is some stiffness to the heart upon relaxation. Normal mild mitral regurgitation Mild aortic valve stenosis with mean gradient of 11 mmHg.-This is source of murmur Small pericardial effusion-continue to treat conservatively   We will continue to monitor clinically the aortic stenosis.  No need for further intervention at this time.  EKG:  Prior sinus rhythm 68 PAC  Recent Labs: No results found for requested labs within last 365 days.  Recent Lipid Panel No results found for: "CHOL", "TRIG", "HDL", "CHOLHDL", "VLDL", "LDLCALC", "LDLDIRECT"   Risk Assessment/Calculations:              Physical Exam:    VS:  BP 138/80   Pulse 64   Ht '5\' 7"'$  (1.702 m)   Wt 156 lb (70.8 kg)   SpO2 98%   BMI 24.43 kg/m     Wt Readings from Last 3 Encounters:  12/15/22 156 lb (70.8 kg)  06/16/22 159 lb (72.1 kg)  05/31/22 159 lb (72.1 kg)     GEN:  Well nourished, well developed in no acute distress HEENT: Normal NECK: No JVD; No carotid bruits LYMPHATICS: No lymphadenopathy CARDIAC: RRR, 2/6 SM, no rubs, gallops, ectopy RESPIRATORY:  Clear to auscultation without rales, wheezing or rhonchi  ABDOMEN: Soft, non-tender, non-distended MUSCULOSKELETAL:  No edema; No deformity  SKIN: Warm and dry NEUROLOGIC:  Alert and oriented x 3 PSYCHIATRIC:  Normal affect   ASSESSMENT:    1. Nonrheumatic aortic valve stenosis   2. Diabetes mellitus with coincident  hypertension ([place])   3. Pure hypercholesterolemia     PLAN:    In order of problems listed above:  Chest heaviness - NUC stress 2023 was low risk. Reassuring.  No further issues at this time.  Continue with diet and exercise.  Mild aortic stenosis/Heart murmur - Systolic murmur noted.  Echocardiogram as above. -No high risk symptoms such as syncope or significant shortness of breath. --continue to monitor  Premature atrial contractions - Noted on ECG.  Likely the symptoms of flutter are from PACs.  Benign.  History of pericardial effusion -  ECHO 2023 stable, small. Should be of no clinical consequence.   Hypertension with diabetes - On low-dose 10/12.5 of lisinopril hydrochlorothiazide.  Occasionally she will feel some lightheadedness when getting up.  If this recurs or if blood pressure gets too low, she may benefit from lisinopril only.  Hyperlipidemia - Continue with Vytorin 10/40.  Excellent.  Last LDL 85 from outside labs, hemoglobin A1c 6.4 hemoglobin 13.9 ALT 18    Medication Adjustments/Labs and Tests Ordered: Current medicines are reviewed at length with the patient today.  Concerns regarding medicines are outlined above.  No orders of the defined types were placed in this encounter.  No orders of the defined types were placed in this encounter.   Patient Instructions  Medication Instructions:  The current medical regimen is effective;  continue present plan and medications.  *If you need a refill on your cardiac medications before your next appointment, please call your pharmacy*  Follow-Up: At [facility], you and your health needs are our priority.  As part of our continuing mission to provide you with exceptional heart care, we have created designated Provider Care Teams.  These Care Teams include your primary Cardiologist (physician) and Advanced Practice Providers (APPs -  Physician Assistants and Nurse Practitioners) who all work together to  provide you with the care you need, when you need it.  We recommend signing up for the patient portal called "MyChart".  Sign up information is provided on this After Visit Summary.  MyChart is used to connect with patients for Virtual Visits (Telemedicine).  Patients are able to view lab/test results, encounter notes, upcoming appointments, etc.  Non-urgent messages can be sent to your provider as well.   To learn more about what you can do with MyChart, go to [email or web address].    Your next appointment:   1 year(s)  Provider:   Candee Furbish, MD        Signed, Candee Furbish, MD  12/15/2022 9:03 AM    [place]

## 2023-02-25 DIAGNOSIS — D1801 Hemangioma of skin and subcutaneous tissue: Secondary | ICD-10-CM | POA: Diagnosis not present

## 2023-02-25 DIAGNOSIS — C50912 Malignant neoplasm of unspecified site of left female breast: Secondary | ICD-10-CM | POA: Diagnosis not present

## 2023-02-25 DIAGNOSIS — L578 Other skin changes due to chronic exposure to nonionizing radiation: Secondary | ICD-10-CM | POA: Diagnosis not present

## 2023-02-25 DIAGNOSIS — D225 Melanocytic nevi of trunk: Secondary | ICD-10-CM | POA: Diagnosis not present

## 2023-02-25 DIAGNOSIS — L821 Other seborrheic keratosis: Secondary | ICD-10-CM | POA: Diagnosis not present

## 2023-02-25 DIAGNOSIS — L814 Other melanin hyperpigmentation: Secondary | ICD-10-CM | POA: Diagnosis not present

## 2023-02-25 DIAGNOSIS — C50911 Malignant neoplasm of unspecified site of right female breast: Secondary | ICD-10-CM | POA: Diagnosis not present

## 2023-02-25 DIAGNOSIS — L247 Irritant contact dermatitis due to plants, except food: Secondary | ICD-10-CM | POA: Diagnosis not present

## 2023-03-15 DIAGNOSIS — C50912 Malignant neoplasm of unspecified site of left female breast: Secondary | ICD-10-CM | POA: Diagnosis not present

## 2023-03-15 DIAGNOSIS — C50911 Malignant neoplasm of unspecified site of right female breast: Secondary | ICD-10-CM | POA: Diagnosis not present

## 2023-04-05 DIAGNOSIS — B349 Viral infection, unspecified: Secondary | ICD-10-CM | POA: Diagnosis not present

## 2023-04-05 DIAGNOSIS — M25612 Stiffness of left shoulder, not elsewhere classified: Secondary | ICD-10-CM | POA: Diagnosis not present

## 2023-04-08 NOTE — Progress Notes (Unsigned)
   Rubin Payor, PhD, LAT, ATC acting as a scribe for [name], MD.  Lisa Bradshaw is a 77 y.o. female who presents to [facility] at [facility] today for L shoulder pain x 2-3 months. No falls or MOI. Pt locates pain to all over the L [facility] joint w/ a tender area in the proximal triceps and sometimes into the anterior chest region.   Neck pain: no Radiates: no Aggravates: holding the hair dryer, aBd, pulling motions Treatments tried: none  Pertinent review of systems: No fevers or chills  Relevant historical information: Hypertension.  History of breast cancer.   Exam:  BP (!) 158/78   Pulse 61   Ht 5\' 7"  (1.702 m)   Wt 155 lb (70.3 kg)   SpO2 96%   BMI 24.28 kg/m  General: Well Developed, well nourished, and in no acute distress.   MSK: Left shoulder normal-appearing Range of motion abduction 120 degrees.  Functional internal rotation limited to posterior iliac crest external rotation is full. Strength is limited 4/5 abduction intact external and internal rotation. Positive Hawking's test negative Neer's test. Negative Yergason's and speeds test.    Lab and Radiology Results  Diagnostic Limited MSK Ultrasound of: Left shoulder Biceps tendon intact normal. Subscapularis tendon is normal-appearing Supraspinatus tendon is intact with mild to moderate subacromial bursitis. Infraspinatus tendon normal. AC joint mild degenerative appearing Impression: Subacromial bursitis   X-ray images left shoulder obtained today personally and independently interpreted No aggressive appearing bony lesions.  Mild AC DJD.  No acute fractures are visible. Await formal radiology review   Assessment and Plan: 77 y.o. female with chronic left shoulder pain thought to be due to rotator cuff tendinopathy and subacromial bursitis.  She is a good candidate for trial of physical therapy.  She lives in [place] which is between [place] and [place].  Plan to  refer to PT at [facility].  Recheck in 8 weeks.   PDMP not reviewed this encounter. Orders Placed This Encounter  Procedures   [place] LIMITED JOINT SPACE STRUCTURES UP LEFT(NO LINKED CHARGES)    Order Specific Question:   Reason for Exam (SYMPTOM  OR DIAGNOSIS REQUIRED)    Answer:   left shoulder pain    Order Specific Question:   Preferred imaging location?    Answer:    [facility] Shoulder Left    Standing Status:   Future    Standing Expiration Date:   05/11/2023    Order Specific Question:   Reason for Exam (SYMPTOM  OR DIAGNOSIS REQUIRED)    Answer:   left shoulder pain    Order Specific Question:   Preferred imaging location?    Answer:   [facility]   Ambulatory referral to Physical Therapy    Referral Priority:   Routine    Referral Type:   Physical Medicine    Referral Reason:   Specialty Services Required    Requested Specialty:   Physical Therapy    Number of Visits Requested:   1   No orders of the defined types were placed in this encounter.    Discussed warning signs or symptoms. Please see discharge instructions. Patient expresses understanding.   The above documentation has been reviewed and is accurate and complete [name], M.D.

## 2023-04-11 ENCOUNTER — Ambulatory Visit (INDEPENDENT_AMBULATORY_CARE_PROVIDER_SITE_OTHER): Payer: PPO

## 2023-04-11 ENCOUNTER — Ambulatory Visit: Payer: Self-pay

## 2023-04-11 ENCOUNTER — Ambulatory Visit: Payer: PPO | Admitting: Family Medicine

## 2023-04-11 VITALS — BP 158/78 | HR 61 | Ht 67.0 in | Wt 155.0 lb

## 2023-04-11 DIAGNOSIS — Z853 Personal history of malignant neoplasm of breast: Secondary | ICD-10-CM | POA: Insufficient documentation

## 2023-04-11 DIAGNOSIS — G8929 Other chronic pain: Secondary | ICD-10-CM | POA: Diagnosis not present

## 2023-04-11 DIAGNOSIS — M25512 Pain in left shoulder: Secondary | ICD-10-CM

## 2023-04-11 DIAGNOSIS — M19012 Primary osteoarthritis, left shoulder: Secondary | ICD-10-CM | POA: Diagnosis not present

## 2023-04-11 NOTE — Patient Instructions (Addendum)
Thank you for coming in today.   Please get an Xray today before you leave   I've referred you to Physical Therapy.  Let us know if you don't hear from them in one week.   Recheck again in 8 weeks.   Let me know sooner if you have any problems.

## 2023-04-15 NOTE — Progress Notes (Signed)
Left shoulder x-ray shows medium arthritis changes

## 2023-04-18 ENCOUNTER — Ambulatory Visit: Payer: PPO | Attending: Family Medicine

## 2023-04-18 ENCOUNTER — Other Ambulatory Visit: Payer: Self-pay

## 2023-04-18 DIAGNOSIS — G8929 Other chronic pain: Secondary | ICD-10-CM | POA: Diagnosis not present

## 2023-04-18 DIAGNOSIS — M25512 Pain in left shoulder: Secondary | ICD-10-CM | POA: Insufficient documentation

## 2023-04-18 DIAGNOSIS — M25612 Stiffness of left shoulder, not elsewhere classified: Secondary | ICD-10-CM | POA: Insufficient documentation

## 2023-04-18 NOTE — Therapy (Signed)
OUTPATIENT PHYSICAL THERAPY SHOULDER EVALUATION   Patient Name: Lisa Bradshaw MRN: [id number] DOB:[birth year], 77 y.o., female Today's Date: 04/18/2023  END OF SESSION:  PT End of Session - 04/18/23 0846     Visit Number 1    Number of Visits 8    Date for PT Re-Evaluation 07/08/23    PT Start Time 0848    PT Stop Time 0933    PT Time Calculation (min) 45 min    Activity Tolerance Patient tolerated treatment well    Behavior During Therapy [facility] for tasks assessed/performed             Past Medical History:  Diagnosis Date   Arthritis    Chest pain    Dental bridge present    upper and lower   Dental crowns present    DM (diabetes mellitus) (HCC)    Estrogen deficiency    Family history of malignant neoplasm of digestive organ    Heart murmur    High cholesterol    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 > 20 yr.   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 breast [facility])    Mass of finger of right hand 02/06/2014   thumb   Mild concentric left ventricular hypertrophy    Mitral valve regurgitation    Non-insulin dependent type 2 diabetes mellitus (HCC)    Osteoarthritis of hand    Pericardial effusion (noninflammatory) 11/08/2000   history of  - no longer sees cardiologist   Seasonal allergies    Past Surgical History:  Procedure Laterality Date   ABDOMINAL HYSTERECTOMY  1992   complete   CESAREAN SECTION  1969   EXCISION METACARPAL MASS Right 02/25/2014   Procedure: EXCISION MASS RIGHT THUMB  ;  Surgeon: Tami Ribas, MD;  Location: [facility];  Service: Orthopedics;  Laterality: Right;   FOOT NEUROMA SURGERY Left 2006   MASTECTOMY Left 04/03/2004   TOTAL MASTECTOMY Right 09/22/2006   with ax. node bx.   TRANSESOPHAGEAL ECHOCARDIOGRAM   08/23/2001   Patient Active Problem List   Diagnosis Date Noted   History of breast cancer in adulthood 04/11/2023   Chronic left shoulder pain 04/11/2023    PCP: Merri Brunette, MD  REFERRING PROVIDER: Rodolph Bong, MD   REFERRING DIAG: Chronic left shoulder pain   THERAPY DIAG:  Stiffness of left shoulder, not elsewhere classified  Chronic left shoulder pain  Rationale for Evaluation and Treatment: Rehabilitation  ONSET DATE: 3-4 months  SUBJECTIVE:  SUBJECTIVE STATEMENT: Patient reports that she has been having left shoulder pain for about 3-4 months. She notes that her shoulder started bothering her, but there was no mechanism of injury. She notes that her shoulder pain stops her from using her arm as much. She has noticed that her left shoulder pops some, but it is not painful.  Hand dominance: Right  PERTINENT HISTORY: History of DVT, arthritis, diabetes, hypertension, history of breast cancer, and allergies  PAIN:  Are you having pain? Yes: NPRS scale: 3-4/10 Pain location: left shoulder Pain description: constant ache and soreness Aggravating factors: sleep (wakes her up at night),  Relieving factors: none known  PRECAUTIONS: None  WEIGHT BEARING RESTRICTIONS: No  FALLS:  Has patient fallen in last 6 months? No  LIVING ENVIRONMENT: Lives with: lives alone Lives in: House/apartment  OCCUPATION: Retired   PLOF: Independent  PATIENT GOALS:reduced pain and to maintain left shoulder mobility   NEXT MD VISIT: 06/06/23  OBJECTIVE:   DIAGNOSTIC FINDINGS: 04/11/23 left shoulder x-ray  IMPRESSION: Moderate severity degenerative changes.  PATIENT SURVEYS:  FOTO 57.99  COGNITION: Overall cognitive status: Within functional limits for tasks assessed     SENSATION: Patient  reports that she has some right thumb numbness  UPPER EXTREMITY ROM:   Active ROM Right eval Left eval  Shoulder flexion 145 108; painful; 140 (PROM)  Shoulder extension    Shoulder abduction 146 99; painful; 120 (PROM)  Shoulder adduction    Shoulder internal rotation To T12 To L2; painful   Shoulder external rotation To T4 To T1; painful   Elbow flexion    Elbow extension    Wrist flexion    Wrist extension    Wrist ulnar deviation    Wrist radial deviation    Wrist pronation    Wrist supination    (Blank rows = not tested)  UPPER EXTREMITY MMT:  MMT Right eval Left eval  Shoulder flexion 4/5 4-/5  Shoulder extension    Shoulder abduction 4/5 4-/5  Shoulder adduction    Shoulder internal rotation 4+/5 4/5  Shoulder external rotation 4+/5 4/5  Middle trapezius    Lower trapezius    Elbow flexion    Elbow extension    Wrist flexion    Wrist extension    Wrist ulnar deviation    Wrist radial deviation    Wrist pronation    Wrist supination    Grip strength (lbs)    (Blank rows = not tested)  JOINT MOBILITY TESTING:  Left AC joint: hypomobile and familiar pain  PALPATION:  TTP: left clavicle and subscapularis insertion   TODAY'S TREATMENT:                                                                                                                                         DATE:  04/18/23 EXERCISE LOG  Exercise Repetitions and Resistance Comments  Doorway stretch     Wall slide                 Blank cell = exercise not performed today   PATIENT EDUCATION: Education details: HEP, POC, healing, prognosis, anatomy, x-ray results, and goals for therapy Person educated: Patient Education method: Explanation Education comprehension: verbalized understanding  HOME EXERCISE PROGRAM: [id number]  ASSESSMENT:  CLINICAL IMPRESSION: Patient is a 77 y.o. female who was seen today for physical therapy evaluation and treatment  for subacute left shoulder pain and stiffness.  She presented with low to moderate pain severity and irritability with left shoulder active range of motion and left acromioclavicular joint mobilizations reproducing her familiar symptoms. She exhibited reduced left shoulder mobility and muscular strength compared to the right shoulder. Recommend that she continue with skilled physical therapy to address her impairments to return to her prior level of function.   OBJECTIVE IMPAIRMENTS: decreased activity tolerance, decreased ROM, decreased strength, hypomobility, impaired UE functional use, and pain.   ACTIVITY LIMITATIONS: lifting and reach over head  PARTICIPATION LIMITATIONS: cleaning, shopping, and community activity  PERSONAL FACTORS: 3+ comorbidities: History of DVT, arthritis, diabetes, hypertension, history of breast cancer, and allergies  are also affecting patient's functional outcome.   REHAB POTENTIAL: Good  CLINICAL DECISION MAKING: Stable/uncomplicated  EVALUATION COMPLEXITY: Low   GOALS: Goals reviewed with patient? Yes  LONG TERM GOALS: Target date:  05/16/23  Patient will be independent with her home exercise program. Baseline:  Goal status: INITIAL  2.  Patient will be able to complete her daily activities without her familiar pain exceeding 1/10. Baseline:  Goal status: INITIAL  3.  Patient will be able to demonstrate at least 130 degrees of left shoulder flexion for improved function reaching overhead.  Baseline:  Goal status: INITIAL  4.  Patient will be able to sleep throughout the night without being awakened due to her familiar left shoulder symptoms.  Baseline:  Goal status: INITIAL  5.  Patient will be able to demonstrate at least 120 degrees of left shoulder abduction for improved function reaching overhead.  Baseline:  Goal status: INITIAL  PLAN:  PT FREQUENCY: 2x/week  PT DURATION: 4 weeks  PLANNED INTERVENTIONS: Therapeutic exercises,  Therapeutic activity, Neuromuscular re-education, Patient/Family education, Self Care, Joint mobilization, Electrical stimulation, Cryotherapy, Moist heat, Vasopneumatic device, Manual therapy, and Re-evaluation  PLAN FOR NEXT SESSION: pulleys, UBE, rotator cuff strengthening, supine cane flexion, and modalities as needed   Granville Lewis, PT 04/18/2023, 9:59 AM

## 2023-04-20 ENCOUNTER — Ambulatory Visit: Payer: PPO

## 2023-04-20 DIAGNOSIS — M25612 Stiffness of left shoulder, not elsewhere classified: Secondary | ICD-10-CM | POA: Diagnosis not present

## 2023-04-20 DIAGNOSIS — G8929 Other chronic pain: Secondary | ICD-10-CM

## 2023-04-20 NOTE — Therapy (Signed)
OUTPATIENT PHYSICAL THERAPY SHOULDER TREATMENT    Patient Name: Lisa Bradshaw MRN: [id number] DOB:[birth year], 77 y.o., female Today's Date: 04/20/2023  END OF SESSION:  PT End of Session - 04/20/23 0805     Visit Number 2    Number of Visits 8    Date for PT Re-Evaluation 07/08/23    PT Start Time 0800    PT Stop Time 0841    PT Time Calculation (min) 41 min    Activity Tolerance Patient tolerated treatment well    Behavior During Therapy [facility] for tasks assessed/performed              Past Medical History:  Diagnosis Date   Arthritis    Chest pain    Dental bridge present    upper and lower   Dental crowns present    DM (diabetes mellitus) (HCC)    Estrogen deficiency    Family history of malignant neoplasm of digestive organ    Heart murmur    High cholesterol    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 > 20 yr.   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 breast [facility])    Mass of finger of right hand 02/06/2014   thumb   Mild concentric left ventricular hypertrophy    Mitral valve regurgitation    Non-insulin dependent type 2 diabetes mellitus (HCC)    Osteoarthritis of hand    Pericardial effusion (noninflammatory) 11/08/2000   history of  - no longer sees cardiologist   Seasonal allergies    Past Surgical History:  Procedure Laterality Date   ABDOMINAL HYSTERECTOMY  1992   complete   CESAREAN SECTION  1969   EXCISION METACARPAL MASS Right 02/25/2014   Procedure: EXCISION MASS RIGHT THUMB  ;  Surgeon: Tami Ribas, MD;  Location: [facility];  Service: Orthopedics;  Laterality: Right;   FOOT NEUROMA SURGERY Left 2006   MASTECTOMY Left 04/03/2004   TOTAL MASTECTOMY Right 09/22/2006   with ax. node bx.   TRANSESOPHAGEAL ECHOCARDIOGRAM   08/23/2001   Patient Active Problem List   Diagnosis Date Noted   History of breast cancer in adulthood 04/11/2023   Chronic left shoulder pain 04/11/2023    PCP: Merri Brunette, MD  REFERRING PROVIDER: Rodolph Bong, MD   REFERRING DIAG: Chronic left shoulder pain   THERAPY DIAG:  Stiffness of left shoulder, not elsewhere classified  Chronic left shoulder pain  Rationale for Evaluation and Treatment: Rehabilitation  ONSET DATE: 3-4 months  SUBJECTIVE:  SUBJECTIVE STATEMENT: Patient reports that she is not hurting right now, but her shoulder popped this morning and felt like bone on bone.  Hand dominance: Right  PERTINENT HISTORY: History of DVT, arthritis, diabetes, hypertension, history of breast cancer, and allergies  PAIN:  Are you having pain? Yes: NPRS scale: 0/10 Pain location: left shoulder Pain description: constant ache and soreness Aggravating factors: sleep (wakes her up at night),  Relieving factors: none known  PRECAUTIONS: None  WEIGHT BEARING RESTRICTIONS: No  FALLS:  Has patient fallen in last 6 months? No  LIVING ENVIRONMENT: Lives with: lives alone Lives in: House/apartment  OCCUPATION: Retired   PLOF: Independent  PATIENT GOALS:reduced pain and to maintain left shoulder mobility   NEXT MD VISIT: 06/06/23  OBJECTIVE:   DIAGNOSTIC FINDINGS: 04/11/23 left shoulder x-ray  IMPRESSION: Moderate severity degenerative changes.  PATIENT SURVEYS:  FOTO 57.99  COGNITION: Overall cognitive status: Within functional limits for tasks assessed     SENSATION: Patient reports that she has some right thumb numbness  UPPER EXTREMITY ROM:   Active ROM Right eval Left eval  Shoulder flexion 145 108; painful; 140 (PROM)  Shoulder extension    Shoulder abduction 146  99; painful; 120 (PROM)  Shoulder adduction    Shoulder internal rotation To T12 To L2; painful   Shoulder external rotation To T4 To T1; painful   Elbow flexion    Elbow extension    Wrist flexion    Wrist extension    Wrist ulnar deviation    Wrist radial deviation    Wrist pronation    Wrist supination    (Blank rows = not tested)  UPPER EXTREMITY MMT:  MMT Right eval Left eval  Shoulder flexion 4/5 4-/5  Shoulder extension    Shoulder abduction 4/5 4-/5  Shoulder adduction    Shoulder internal rotation 4+/5 4/5  Shoulder external rotation 4+/5 4/5  Middle trapezius    Lower trapezius    Elbow flexion    Elbow extension    Wrist flexion    Wrist extension    Wrist ulnar deviation    Wrist radial deviation    Wrist pronation    Wrist supination    Grip strength (lbs)    (Blank rows = not tested)  JOINT MOBILITY TESTING:  Left AC joint: hypomobile and familiar pain  PALPATION:  TTP: left clavicle and subscapularis insertion   TODAY'S TREATMENT:                                                                                                                                         DATE:                                    6/12 EXERCISE LOG  Exercise Repetitions and Resistance Comments  Pulleys 4 minutes  Doorway stretch  3 x 30 seconds    Bilateral ER  Red t-band x 2 minutes   Function IR stretch  4 x 30 seconds   Resisted pull down  Green t-band x 3 minutes   Wall ladder  2 minutes Max 29#  Resisted IR  Green t-band x 2 minutes   Standing cane flexion  3# x 12 reps     Blank cell = exercise not performed today                                    04/18/23 EXERCISE LOG  Exercise Repetitions and Resistance Comments  Doorway stretch     Wall slide                 Blank cell = exercise not performed today   PATIENT EDUCATION: Education details: benefits of exercise Person educated: Patient Education method: Explanation Education comprehension:  verbalized understanding  HOME EXERCISE PROGRAM: [id number]  ASSESSMENT:  CLINICAL IMPRESSION: Patient was introduced to multiple new interventions for improved left shoulder strength and mobility with moderate difficulty. She required moderate cueing with resisted external rotation to maintain proper biomechanics to isolate infraspinatus engagement. She experienced no significant pain or discomfort with any of today's interventions. She reported feeling good upon the conclusion of treatment. She continues to require skilled physical therapy to address her remaining impairments to return to her prior level of function.    OBJECTIVE IMPAIRMENTS: decreased activity tolerance, decreased ROM, decreased strength, hypomobility, impaired UE functional use, and pain.   ACTIVITY LIMITATIONS: lifting and reach over head  PARTICIPATION LIMITATIONS: cleaning, shopping, and community activity  PERSONAL FACTORS: 3+ comorbidities: History of DVT, arthritis, diabetes, hypertension, history of breast cancer, and allergies  are also affecting patient's functional outcome.   REHAB POTENTIAL: Good  CLINICAL DECISION MAKING: Stable/uncomplicated  EVALUATION COMPLEXITY: Low   GOALS: Goals reviewed with patient? Yes  LONG TERM GOALS: Target date:  05/16/23  Patient will be independent with her home exercise program. Baseline:  Goal status: INITIAL  2.  Patient will be able to complete her daily activities without her familiar pain exceeding 1/10. Baseline:  Goal status: INITIAL  3.  Patient will be able to demonstrate at least 130 degrees of left shoulder flexion for improved function reaching overhead.  Baseline:  Goal status: INITIAL  4.  Patient will be able to sleep throughout the night without being awakened due to her familiar left shoulder symptoms.  Baseline:  Goal status: INITIAL  5.  Patient will be able to demonstrate at least 120 degrees of left shoulder abduction for improved function  reaching overhead.  Baseline:  Goal status: INITIAL  PLAN:  PT FREQUENCY: 2x/week  PT DURATION: 4 weeks  PLANNED INTERVENTIONS: Therapeutic exercises, Therapeutic activity, Neuromuscular re-education, Patient/Family education, Self Care, Joint mobilization, Electrical stimulation, Cryotherapy, Moist heat, Vasopneumatic device, Manual therapy, and Re-evaluation  PLAN FOR NEXT SESSION: pulleys, UBE, rotator cuff strengthening, supine cane flexion, and modalities as needed   Granville Lewis, PT 04/20/2023, 8:43 AM

## 2023-05-04 ENCOUNTER — Ambulatory Visit: Payer: PPO

## 2023-05-04 DIAGNOSIS — G8929 Other chronic pain: Secondary | ICD-10-CM

## 2023-05-04 DIAGNOSIS — M25612 Stiffness of left shoulder, not elsewhere classified: Secondary | ICD-10-CM | POA: Diagnosis not present

## 2023-05-04 NOTE — Therapy (Signed)
OUTPATIENT PHYSICAL THERAPY SHOULDER TREATMENT    Patient Name: Lisa Bradshaw MRN: [id number] DOB:1946/05/20, 77 y.o., female Today's Date: 05/04/2023  END OF SESSION:  PT End of Session - 05/04/23 0800     Visit Number 3    Number of Visits 8    Date for PT Re-Evaluation 07/08/23    PT Start Time 0801    PT Stop Time 0845    PT Time Calculation (min) 44 min    Activity Tolerance Patient tolerated treatment well    Behavior During Therapy [facility] for tasks assessed/performed              Past Medical History:  Diagnosis Date   Arthritis    Chest pain    Dental bridge present    upper and lower   Dental crowns present    DM (diabetes mellitus) (HCC)    Estrogen deficiency    Family history of malignant neoplasm of digestive organ    Heart murmur    High cholesterol    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 > 20 yr.   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 breast [facility])    Mass of finger of right hand 02/06/2014   thumb   Mild concentric left ventricular hypertrophy    Mitral valve regurgitation    Non-insulin dependent type 2 diabetes mellitus (HCC)    Osteoarthritis of hand    Pericardial effusion (noninflammatory) 11/08/2000   history of  - no longer sees cardiologist   Seasonal allergies    Past Surgical History:  Procedure Laterality Date   ABDOMINAL HYSTERECTOMY  1992   complete   CESAREAN SECTION  1969   EXCISION METACARPAL MASS Right 02/25/2014   Procedure: EXCISION MASS RIGHT THUMB  ;  Surgeon: Tami Ribas, MD;  Location: [facility];  Service: Orthopedics;  Laterality: Right;   FOOT NEUROMA SURGERY Left 2006   MASTECTOMY Left 04/03/2004   TOTAL MASTECTOMY Right 09/22/2006   with ax. node bx.   TRANSESOPHAGEAL ECHOCARDIOGRAM   08/23/2001   Patient Active Problem List   Diagnosis Date Noted   History of breast cancer in adulthood 04/11/2023   Chronic left shoulder pain 04/11/2023    PCP: Merri Brunette, MD  REFERRING PROVIDER: Rodolph Bong, MD   REFERRING DIAG: Chronic left shoulder pain   THERAPY DIAG:  Stiffness of left shoulder, not elsewhere classified  Chronic left shoulder pain  Rationale for Evaluation and Treatment: Rehabilitation  ONSET DATE: 3-4 months  SUBJECTIVE:  SUBJECTIVE STATEMENT: Patient reports that she feels better today, but she was sore the day after her last appointment.  Hand dominance: Right  PERTINENT HISTORY: History of DVT, arthritis, diabetes, hypertension, history of breast cancer, and allergies  PAIN:  Are you having pain? Yes: NPRS scale: 0/10 Pain location: left shoulder Pain description: constant ache and soreness Aggravating factors: sleep (wakes her up at night),  Relieving factors: none known  PRECAUTIONS: None  WEIGHT BEARING RESTRICTIONS: No  FALLS:  Has patient fallen in last 6 months? No  LIVING ENVIRONMENT: Lives with: lives alone Lives in: House/apartment  OCCUPATION: Retired   PLOF: Independent  PATIENT GOALS:reduced pain and to maintain left shoulder mobility   NEXT MD VISIT: 06/06/23  OBJECTIVE:   DIAGNOSTIC FINDINGS: 04/11/23 left shoulder x-ray  IMPRESSION: Moderate severity degenerative changes.  PATIENT SURVEYS:  FOTO 57.99  COGNITION: Overall cognitive status: Within functional limits for tasks assessed     SENSATION: Patient reports that she has some right thumb numbness  UPPER EXTREMITY ROM:   Active ROM Right eval Left eval  Shoulder flexion 145 108; painful; 140 (PROM)  Shoulder extension    Shoulder abduction 146 99; painful; 120  (PROM)  Shoulder adduction    Shoulder internal rotation To T12 To L2; painful   Shoulder external rotation To T4 To T1; painful   Elbow flexion    Elbow extension    Wrist flexion    Wrist extension    Wrist ulnar deviation    Wrist radial deviation    Wrist pronation    Wrist supination    (Blank rows = not tested)  UPPER EXTREMITY MMT:  MMT Right eval Left eval  Shoulder flexion 4/5 4-/5  Shoulder extension    Shoulder abduction 4/5 4-/5  Shoulder adduction    Shoulder internal rotation 4+/5 4/5  Shoulder external rotation 4+/5 4/5  Middle trapezius    Lower trapezius    Elbow flexion    Elbow extension    Wrist flexion    Wrist extension    Wrist ulnar deviation    Wrist radial deviation    Wrist pronation    Wrist supination    Grip strength (lbs)    (Blank rows = not tested)  JOINT MOBILITY TESTING:  Left AC joint: hypomobile and familiar pain  PALPATION:  TTP: left clavicle and subscapularis insertion   TODAY'S TREATMENT:                                                                                                                                         DATE:                                    05/04/23 EXERCISE LOG  Exercise Repetitions and Resistance Comments  Pulleys  5 minutes    Resisted pull  down  Green t-band x 2 minutes   Resisted shoulder ADD  Green t-band x 2 minutes LUE only   Wall push up  2 minutes   Resisted IR  Green t-band x 3 minutes   Functional IR stretch  30 reps w/ 5 second hold    Bilateral ER  Green t-band x 3 minutes   UBE X6 minutes @ 90 RPM    Blank cell = exercise not performed today                                    6/12 EXERCISE LOG  Exercise Repetitions and Resistance Comments  Pulleys 4 minutes    Doorway stretch  3 x 30 seconds    Bilateral ER  Red t-band x 2 minutes   Function IR stretch  4 x 30 seconds   Resisted pull down  Green t-band x 3 minutes   Wall ladder  2 minutes Max 29#  Resisted IR  Green t-band  x 2 minutes   Standing cane flexion  3# x 12 reps     Blank cell = exercise not performed today   PATIENT EDUCATION: Education details: benefits of exercise Person educated: Patient Education method: Explanation Education comprehension: verbalized understanding  HOME EXERCISE PROGRAM: [id number]  ASSESSMENT:  CLINICAL IMPRESSION: Patient was progressed with wall push ups and resisted shoulder adduction in addition to familiar interventions for improved rotator cuff strengthening with moderate difficulty. She required minimal cueing with resisted pull downs to maintain elbow extension to facilitate periscapular engagement needed for shoulder stability. She experienced a temporary increase in left shoulder pain with functional internal rotation, but this did not persist into today's other interventions. She reported that her shoulder felt good upon the conclusion of treatment. She continues to require skilled physical therapy to address her remaining impairments to return to her prior level of function.    OBJECTIVE IMPAIRMENTS: decreased activity tolerance, decreased ROM, decreased strength, hypomobility, impaired UE functional use, and pain.   ACTIVITY LIMITATIONS: lifting and reach over head  PARTICIPATION LIMITATIONS: cleaning, shopping, and community activity  PERSONAL FACTORS: 3+ comorbidities: History of DVT, arthritis, diabetes, hypertension, history of breast cancer, and allergies  are also affecting patient's functional outcome.   REHAB POTENTIAL: Good  CLINICAL DECISION MAKING: Stable/uncomplicated  EVALUATION COMPLEXITY: Low   GOALS: Goals reviewed with patient? Yes  LONG TERM GOALS: Target date:  05/16/23  Patient will be independent with her home exercise program. Baseline:  Goal status: INITIAL  2.  Patient will be able to complete her daily activities without her familiar pain exceeding 1/10. Baseline:  Goal status: INITIAL  3.  Patient will be able to  demonstrate at least 130 degrees of left shoulder flexion for improved function reaching overhead.  Baseline:  Goal status: INITIAL  4.  Patient will be able to sleep throughout the night without being awakened due to her familiar left shoulder symptoms.  Baseline:  Goal status: INITIAL  5.  Patient will be able to demonstrate at least 120 degrees of left shoulder abduction for improved function reaching overhead.  Baseline:  Goal status: INITIAL  PLAN:  PT FREQUENCY: 2x/week  PT DURATION: 4 weeks  PLANNED INTERVENTIONS: Therapeutic exercises, Therapeutic activity, Neuromuscular re-education, Patient/Family education, Self Care, Joint mobilization, Electrical stimulation, Cryotherapy, Moist heat, Vasopneumatic device, Manual therapy, and Re-evaluation  PLAN FOR NEXT SESSION: pulleys, UBE, rotator cuff strengthening, supine cane flexion, and  modalities as needed   Granville Lewis, PT 05/04/2023, 12:37 PM

## 2023-05-11 ENCOUNTER — Ambulatory Visit: Payer: PPO | Attending: Family Medicine

## 2023-05-11 DIAGNOSIS — M25612 Stiffness of left shoulder, not elsewhere classified: Secondary | ICD-10-CM | POA: Diagnosis not present

## 2023-05-11 DIAGNOSIS — M25512 Pain in left shoulder: Secondary | ICD-10-CM | POA: Insufficient documentation

## 2023-05-11 DIAGNOSIS — G8929 Other chronic pain: Secondary | ICD-10-CM | POA: Insufficient documentation

## 2023-05-11 NOTE — Therapy (Signed)
OUTPATIENT PHYSICAL THERAPY SHOULDER TREATMENT    Patient Name: Lisa Bradshaw MRN: [id number] DOB:[birth year], 77 y.o., female Today's Date: 05/11/2023  END OF SESSION:  PT End of Session - 05/11/23 0759     Visit Number 4    Number of Visits 8    Date for PT Re-Evaluation 07/08/23    PT Start Time 0800    PT Stop Time 0842    PT Time Calculation (min) 42 min    Activity Tolerance Patient tolerated treatment well    Behavior During Therapy [facility] for tasks assessed/performed              Past Medical History:  Diagnosis Date   Arthritis    Chest pain    Dental bridge present    upper and lower   Dental crowns present    DM (diabetes mellitus) (HCC)    Estrogen deficiency    Family history of malignant neoplasm of digestive organ    Heart murmur    High cholesterol    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 > 20 yr.   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 breast [facility])    Mass of finger of right hand 02/06/2014   thumb   Mild concentric left ventricular hypertrophy    Mitral valve regurgitation    Non-insulin dependent type 2 diabetes mellitus (HCC)    Osteoarthritis of hand    Pericardial effusion (noninflammatory) 11/08/2000   history of  - no longer sees cardiologist   Seasonal allergies    Past Surgical History:  Procedure Laterality Date   ABDOMINAL HYSTERECTOMY  1992   complete   CESAREAN SECTION  1969   EXCISION METACARPAL MASS Right 02/25/2014   Procedure: EXCISION MASS RIGHT THUMB  ;  Surgeon: Tami Ribas, MD;  Location: [facility];  Service: Orthopedics;  Laterality: Right;   FOOT NEUROMA SURGERY Left 2006   MASTECTOMY Left 04/03/2004   TOTAL MASTECTOMY Right 09/22/2006   with ax. node bx.   TRANSESOPHAGEAL ECHOCARDIOGRAM   08/23/2001   Patient Active Problem List   Diagnosis Date Noted   History of breast cancer in adulthood 04/11/2023   Chronic left shoulder pain 04/11/2023    PCP: Merri Brunette, MD  REFERRING PROVIDER: Rodolph Bong, MD   REFERRING DIAG: Chronic left shoulder pain   THERAPY DIAG:  Stiffness of left shoulder, not elsewhere classified  Chronic left shoulder pain  Rationale for Evaluation and Treatment: Rehabilitation  ONSET DATE: 3-4 months  SUBJECTIVE:  SUBJECTIVE STATEMENT: Patient reports that her shoulder is a little sore today, but she feels like her shoulder is getting better.  Hand dominance: Right  PERTINENT HISTORY: History of DVT, arthritis, diabetes, hypertension, history of breast cancer, and allergies  PAIN:  Are you having pain? Yes: NPRS scale: 2-3/10 Pain location: left shoulder Pain description: constant ache and soreness Aggravating factors: sleep (wakes her up at night),  Relieving factors: none known  PRECAUTIONS: None  WEIGHT BEARING RESTRICTIONS: No  FALLS:  Has patient fallen in last 6 months? No  LIVING ENVIRONMENT: Lives with: lives alone Lives in: House/apartment  OCCUPATION: Retired   PLOF: Independent  PATIENT GOALS:reduced pain and to maintain left shoulder mobility   NEXT MD VISIT: 06/06/23  OBJECTIVE:   DIAGNOSTIC FINDINGS: 04/11/23 left shoulder x-ray  IMPRESSION: Moderate severity degenerative changes.  PATIENT SURVEYS:  FOTO 57.99  COGNITION: Overall cognitive status: Within functional limits for tasks assessed     SENSATION: Patient reports that she has some right thumb numbness  UPPER EXTREMITY ROM:   Active ROM Right eval Left eval  Shoulder flexion 145 108; painful; 140 (PROM)  Shoulder extension    Shoulder abduction 146 99;  painful; 120 (PROM)  Shoulder adduction    Shoulder internal rotation To T12 To L2; painful   Shoulder external rotation To T4 To T1; painful   Elbow flexion    Elbow extension    Wrist flexion    Wrist extension    Wrist ulnar deviation    Wrist radial deviation    Wrist pronation    Wrist supination    (Blank rows = not tested)  UPPER EXTREMITY MMT:  MMT Right eval Left eval  Shoulder flexion 4/5 4-/5  Shoulder extension    Shoulder abduction 4/5 4-/5  Shoulder adduction    Shoulder internal rotation 4+/5 4/5  Shoulder external rotation 4+/5 4/5  Middle trapezius    Lower trapezius    Elbow flexion    Elbow extension    Wrist flexion    Wrist extension    Wrist ulnar deviation    Wrist radial deviation    Wrist pronation    Wrist supination    Grip strength (lbs)    (Blank rows = not tested)  JOINT MOBILITY TESTING:  Left AC joint: hypomobile and familiar pain  PALPATION:  TTP: left clavicle and subscapularis insertion   TODAY'S TREATMENT:                                                                                                                                         DATE:                                    05/11/23 EXERCISE LOG  Exercise Repetitions and Resistance Comments  Pulleys  5 minutes  Flexion  Cane press up  3# x 30 reps    Resisted row  Blue t-band x 2 minutes   Posterior lift off  20 reps    Wall push up  2 minutes   Functional IR stretch 2 minutes    Blank cell = exercise not performed today  Manual Therapy Soft Tissue Mobilization: left subscapularis and biceps, for reduced pain and tone  Joint Mobilizations: glenohumeral inferior, posterior, and lateral distraction, grade I-IV                                     05/04/23 EXERCISE LOG  Exercise Repetitions and Resistance Comments  Pulleys  5 minutes    Resisted pull down  Green t-band x 2 minutes   Resisted shoulder ADD  Green t-band x 2 minutes LUE only   Wall push up  2 minutes    Resisted IR  Green t-band x 3 minutes   Functional IR stretch  30 reps w/ 5 second hold    Bilateral ER  Green t-band x 3 minutes   UBE X6 minutes @ 90 RPM    Blank cell = exercise not performed today                                    6/12 EXERCISE LOG  Exercise Repetitions and Resistance Comments  Pulleys 4 minutes    Doorway stretch  3 x 30 seconds    Bilateral ER  Red t-band x 2 minutes   Function IR stretch  4 x 30 seconds   Resisted pull down  Green t-band x 3 minutes   Wall ladder  2 minutes Max 29#  Resisted IR  Green t-band x 2 minutes   Standing cane flexion  3# x 12 reps     Blank cell = exercise not performed today   PATIENT EDUCATION: Education details: benefits of exercise, reviewed xray results Person educated: Patient Education method: Explanation Education comprehension: verbalized understanding  HOME EXERCISE PROGRAM: [id number]  ASSESSMENT:  CLINICAL IMPRESSION: Treatment focused on familiar interventions for improved shoulder mobility through the use of manual therapy followed by appropriately matched interventions. Manual therapy focused on glenohumeral joint mobilizations for improved mobility with moderate effectiveness. She required minimal cueing with today's interventions for proper biomechanics to avoid compensatory movement patterns. She reported feeling a little better upon the conclusion of treatment. She continues to require skilled physical therapy to address her remaining impairments to return to her prior level of function.    OBJECTIVE IMPAIRMENTS: decreased activity tolerance, decreased ROM, decreased strength, hypomobility, impaired UE functional use, and pain.   ACTIVITY LIMITATIONS: lifting and reach over head  PARTICIPATION LIMITATIONS: cleaning, shopping, and community activity  PERSONAL FACTORS: 3+ comorbidities: History of DVT, arthritis, diabetes, hypertension, history of breast cancer, and allergies  are also affecting patient's  functional outcome.   REHAB POTENTIAL: Good  CLINICAL DECISION MAKING: Stable/uncomplicated  EVALUATION COMPLEXITY: Low   GOALS: Goals reviewed with patient? Yes  LONG TERM GOALS: Target date:  05/16/23  Patient will be independent with her home exercise program. Baseline:  Goal status: INITIAL  2.  Patient will be able to complete her daily activities without her familiar pain exceeding 1/10. Baseline:  Goal status: INITIAL  3.  Patient will be able to demonstrate at least 130 degrees of left shoulder flexion  for improved function reaching overhead.  Baseline:  Goal status: INITIAL  4.  Patient will be able to sleep throughout the night without being awakened due to her familiar left shoulder symptoms.  Baseline:  Goal status: INITIAL  5.  Patient will be able to demonstrate at least 120 degrees of left shoulder abduction for improved function reaching overhead.  Baseline:  Goal status: INITIAL  PLAN:  PT FREQUENCY: 2x/week  PT DURATION: 4 weeks  PLANNED INTERVENTIONS: Therapeutic exercises, Therapeutic activity, Neuromuscular re-education, Patient/Family education, Self Care, Joint mobilization, Electrical stimulation, Cryotherapy, Moist heat, Vasopneumatic device, Manual therapy, and Re-evaluation  PLAN FOR NEXT SESSION: pulleys, UBE, rotator cuff strengthening, supine cane flexion, and modalities as needed   Granville Lewis, PT 05/11/2023, 12:54 PM

## 2023-05-25 ENCOUNTER — Ambulatory Visit: Payer: PPO

## 2023-05-25 DIAGNOSIS — G8929 Other chronic pain: Secondary | ICD-10-CM

## 2023-05-25 DIAGNOSIS — M25612 Stiffness of left shoulder, not elsewhere classified: Secondary | ICD-10-CM | POA: Diagnosis not present

## 2023-05-25 NOTE — Therapy (Signed)
OUTPATIENT PHYSICAL THERAPY SHOULDER TREATMENT    Patient Name: Lisa Bradshaw MRN: [id number] DOB:1945/11/23, 77 y.o., female Today's Date: 05/25/2023  END OF SESSION:  PT End of Session - 05/25/23 0846     Visit Number 5    Number of Visits 8    Date for PT Re-Evaluation 07/08/23    PT Start Time 0845    PT Stop Time 0930    PT Time Calculation (min) 45 min    Activity Tolerance Patient tolerated treatment well    Behavior During Therapy [name] for tasks assessed/performed               Past Medical History:  Diagnosis Date   Arthritis    Chest pain    Dental bridge present    upper and lower   Dental crowns present    DM (diabetes mellitus) (HCC)    Estrogen deficiency    Family history of malignant neoplasm of digestive organ    Heart murmur    High cholesterol    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 > 20 yr.   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 breast [facility])    Mass of finger of right hand 02/06/2014   thumb   Mild concentric left ventricular hypertrophy    Mitral valve regurgitation    Non-insulin dependent type 2 diabetes mellitus (HCC)    Osteoarthritis of hand    Pericardial effusion (noninflammatory) 11/08/2000   history of  - no longer sees cardiologist   Seasonal allergies    Past Surgical History:  Procedure Laterality Date   ABDOMINAL HYSTERECTOMY  1992   complete   CESAREAN SECTION  1969   EXCISION METACARPAL MASS Right 02/25/2014   Procedure: EXCISION MASS RIGHT THUMB  ;  Surgeon: Tami Ribas, MD;  Location: [facility];  Service: Orthopedics;  Laterality: Right;   FOOT NEUROMA SURGERY Left 2006   MASTECTOMY Left 04/03/2004   TOTAL MASTECTOMY Right 09/22/2006   with ax. node bx.   TRANSESOPHAGEAL ECHOCARDIOGRAM   08/23/2001   Patient Active Problem List   Diagnosis Date Noted   History of breast cancer in adulthood 04/11/2023   Chronic left shoulder pain 04/11/2023    PCP: Merri Brunette, MD  REFERRING PROVIDER: Rodolph Bong, MD   REFERRING DIAG: Chronic left shoulder pain   THERAPY DIAG:  Stiffness of left shoulder, not elsewhere classified  Chronic left shoulder pain  Rationale for Evaluation and Treatment: Rehabilitation  ONSET DATE: 3-4 months  SUBJECTIVE:  SUBJECTIVE STATEMENT: Patient reports that her shoulder has been hurting more since her last appointment. She notes that it started bothering her the next day.  Hand dominance: Right  PERTINENT HISTORY: History of DVT, arthritis, diabetes, hypertension, history of breast cancer, and allergies  PAIN:  Are you having pain? Yes: NPRS scale: no pain score provided/10 Pain location: left shoulder Pain description: constant ache and soreness Aggravating factors: sleep (wakes her up at night),  Relieving factors: none known  PRECAUTIONS: None  WEIGHT BEARING RESTRICTIONS: No  FALLS:  Has patient fallen in last 6 months? No  LIVING ENVIRONMENT: Lives with: lives alone Lives in: House/apartment  OCCUPATION: Retired   PLOF: Independent  PATIENT GOALS:reduced pain and to maintain left shoulder mobility   NEXT MD VISIT: 06/06/23  OBJECTIVE:   DIAGNOSTIC FINDINGS: 04/11/23 left shoulder x-ray  IMPRESSION: Moderate severity degenerative changes.  PATIENT SURVEYS:  FOTO 54.89 on  05/25/23  COGNITION: Overall cognitive status: Within functional limits for tasks assessed     SENSATION: Patient reports that she has some right thumb numbness  UPPER EXTREMITY ROM:   Active ROM Right eval Left eval  Shoulder flexion 145 108; painful; 140  (PROM)  Shoulder extension    Shoulder abduction 146 99; painful; 120 (PROM)  Shoulder adduction    Shoulder internal rotation To T12 To L2; painful   Shoulder external rotation To T4 To T1; painful   Elbow flexion    Elbow extension    Wrist flexion    Wrist extension    Wrist ulnar deviation    Wrist radial deviation    Wrist pronation    Wrist supination    (Blank rows = not tested)  UPPER EXTREMITY MMT:  MMT Right eval Left eval  Shoulder flexion 4/5 4-/5  Shoulder extension    Shoulder abduction 4/5 4-/5  Shoulder adduction    Shoulder internal rotation 4+/5 4/5  Shoulder external rotation 4+/5 4/5  Middle trapezius    Lower trapezius    Elbow flexion    Elbow extension    Wrist flexion    Wrist extension    Wrist ulnar deviation    Wrist radial deviation    Wrist pronation    Wrist supination    Grip strength (lbs)    (Blank rows = not tested)  JOINT MOBILITY TESTING:  Left AC joint: hypomobile and familiar pain  PALPATION:  TTP: left clavicle and subscapularis insertion   TODAY'S TREATMENT:                                                                                                                                         DATE:                                    05/25/23 EXERCISE LOG  Exercise Repetitions and  Resistance Comments  Pulleys 5 minutes   Resisted row Green t-band x 3 minutes   Resisted IR  Green t-band x 2 minutes   Resisted pull down  Green t-band x 2 minutes   L shoulder ADD isometric  2 minutes w/ 5 second hold   Resisted ER  Red t-band x 2 minutes    Blank cell = exercise not performed today  Manual Therapy Soft Tissue Mobilization: left bicep, supraspinatus, for reduced pain                                     05/11/23 EXERCISE LOG  Exercise Repetitions and Resistance Comments  Pulleys  5 minutes  Flexion  Cane press up  3# x 30 reps    Resisted row  Blue t-band x 2 minutes   Posterior lift off  20 reps    Wall push up  2  minutes   Functional IR stretch 2 minutes    Blank cell = exercise not performed today  Manual Therapy Soft Tissue Mobilization: left subscapularis and biceps, for reduced pain and tone  Joint Mobilizations: glenohumeral inferior, posterior, and lateral distraction, grade I-IV                                     05/04/23 EXERCISE LOG  Exercise Repetitions and Resistance Comments  Pulleys  5 minutes    Resisted pull down  Green t-band x 2 minutes   Resisted shoulder ADD  Green t-band x 2 minutes LUE only   Wall push up  2 minutes   Resisted IR  Green t-band x 3 minutes   Functional IR stretch  30 reps w/ 5 second hold    Bilateral ER  Green t-band x 3 minutes   UBE X6 minutes @ 90 RPM    Blank cell = exercise not performed today   PATIENT EDUCATION: Education details: benefits of exercise, activity modification, shoulder anatomy, muscular healing Person educated: Patient Education method: Explanation Education comprehension: verbalized understanding  HOME EXERCISE PROGRAM: [id number]  ASSESSMENT:  CLINICAL IMPRESSION: Patient presented to treatment reporting increased left shoulder pain since her last appointment on 05/11/23. Treatment focused on familiar interventions for improved rotator cuff strengthening. She had multiple questions regarding rotator cuff anatomy and the benefits of exercise and these questions were answered and she reported understanding. Soft tissue mobilization was attempted to reduce her familiar left shoulder. However, this was unable to provide a significant change to her familiar symptoms. She reported feeling "about the same" upon the conclusion of treatment. She continues to require skilled physical therapy to address her remaining impairments to return to her prior level of function.   OBJECTIVE IMPAIRMENTS: decreased activity tolerance, decreased ROM, decreased strength, hypomobility, impaired UE functional use, and pain.   ACTIVITY LIMITATIONS: lifting  and reach over head  PARTICIPATION LIMITATIONS: cleaning, shopping, and community activity  PERSONAL FACTORS: 3+ comorbidities: History of DVT, arthritis, diabetes, hypertension, history of breast cancer, and allergies  are also affecting patient's functional outcome.   REHAB POTENTIAL: Good  CLINICAL DECISION MAKING: Stable/uncomplicated  EVALUATION COMPLEXITY: Low   GOALS: Goals reviewed with patient? Yes  LONG TERM GOALS: Target date:  05/16/23  Patient will be independent with her home exercise program. Baseline:  Goal status: INITIAL  2.  Patient will be able to complete her  daily activities without her familiar pain exceeding 1/10. Baseline:  Goal status: INITIAL  3.  Patient will be able to demonstrate at least 130 degrees of left shoulder flexion for improved function reaching overhead.  Baseline:  Goal status: INITIAL  4.  Patient will be able to sleep throughout the night without being awakened due to her familiar left shoulder symptoms.  Baseline:  Goal status: INITIAL  5.  Patient will be able to demonstrate at least 120 degrees of left shoulder abduction for improved function reaching overhead.  Baseline:  Goal status: INITIAL  PLAN:  PT FREQUENCY: 2x/week  PT DURATION: 4 weeks  PLANNED INTERVENTIONS: Therapeutic exercises, Therapeutic activity, Neuromuscular re-education, Patient/Family education, Self Care, Joint mobilization, Electrical stimulation, Cryotherapy, Moist heat, Vasopneumatic device, Manual therapy, and Re-evaluation  PLAN FOR NEXT SESSION: pulleys, UBE, rotator cuff strengthening, supine cane flexion, and modalities as needed   Granville Lewis, PT 05/25/2023, 9:37 AM

## 2023-06-01 ENCOUNTER — Ambulatory Visit: Payer: PPO

## 2023-06-01 DIAGNOSIS — G8929 Other chronic pain: Secondary | ICD-10-CM

## 2023-06-01 DIAGNOSIS — M25612 Stiffness of left shoulder, not elsewhere classified: Secondary | ICD-10-CM

## 2023-06-01 NOTE — Therapy (Addendum)
 OUTPATIENT PHYSICAL THERAPY SHOULDER TREATMENT    Patient Name: Lisa Bradshaw MRN: [id number] DOB:1946/08/24, 77 y.o., female Today's Date: 06/01/2023  END OF SESSION:  PT End of Session - 06/01/23 0804     Visit Number 6    Number of Visits 8    Date for PT Re-Evaluation 07/08/23    PT Start Time 0801    PT Stop Time 0845    PT Time Calculation (min) 44 min    Activity Tolerance Patient tolerated treatment well    Behavior During Therapy [facility] for tasks assessed/performed                Past Medical History:  Diagnosis Date   Arthritis    Chest pain    Dental bridge present    upper and lower   Dental crowns present    DM (diabetes mellitus) (HCC)    Estrogen deficiency    Family history of malignant neoplasm of digestive organ    Heart murmur    High cholesterol    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 > 20 yr.   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 breast [facility])    Mass of finger of right hand 02/06/2014   thumb   Mild concentric left ventricular hypertrophy    Mitral valve regurgitation    Non-insulin dependent type 2 diabetes mellitus (HCC)    Osteoarthritis of hand    Pericardial effusion (noninflammatory) 11/08/2000   history of  - no longer sees cardiologist   Seasonal allergies    Past Surgical History:  Procedure Laterality Date   ABDOMINAL HYSTERECTOMY  1992   complete   CESAREAN SECTION  1969   EXCISION METACARPAL MASS Right 02/25/2014   Procedure: EXCISION MASS RIGHT THUMB  ;  Surgeon: Milagros Alf, MD;  Location: [facility];  Service: Orthopedics;  Laterality: Right;   FOOT NEUROMA SURGERY Left 2006   MASTECTOMY Left 04/03/2004   TOTAL MASTECTOMY Right 09/22/2006   with ax. node bx.   TRANSESOPHAGEAL ECHOCARDIOGRAM   08/23/2001   Patient Active Problem List   Diagnosis Date Noted   History of breast cancer in adulthood 04/11/2023   Chronic left shoulder pain 04/11/2023    PCP: Faustina Hood, MD  REFERRING PROVIDER: Syliva Even, MD   REFERRING DIAG: Chronic left shoulder pain   THERAPY DIAG:  Stiffness of left shoulder, not elsewhere classified  Chronic left shoulder pain  Rationale for Evaluation and Treatment: Rehabilitation  ONSET DATE: 3-4 months  SUBJECTIVE:  SUBJECTIVE STATEMENT: Patient reports that her shoulder is feeling better this week than she did last week. She notes that it still hurts some throughout the day, but she can live with this pain. She feels like her motion has improved, but her pain is still the same.  Hand dominance: Right  PERTINENT HISTORY: History of DVT, arthritis, diabetes, hypertension, history of breast cancer, and allergies  PAIN:  Are you having pain? Yes: NPRS scale: no pain score provided/10 Pain location: left shoulder Pain description: constant ache and soreness Aggravating factors: sleep (wakes her up at night),  Relieving factors: none known  PRECAUTIONS: None  WEIGHT BEARING RESTRICTIONS: No  FALLS:  Has patient fallen in last 6 months? No  LIVING ENVIRONMENT: Lives with: lives alone Lives in: House/apartment  OCCUPATION: Retired   PLOF: Independent  PATIENT GOALS:reduced pain and to maintain left shoulder mobility   NEXT MD VISIT: 06/06/23  OBJECTIVE:   DIAGNOSTIC FINDINGS: 04/11/23 left shoulder x-ray  IMPRESSION: Moderate severity degenerative changes.  PATIENT SURVEYS:  FOTO 62.30 on  06/01/23  COGNITION: Overall cognitive status: Within functional limits for tasks assessed     SENSATION: Patient reports that she has some right thumb  numbness  UPPER EXTREMITY ROM:   Active ROM Right eval Left eval  Shoulder flexion 145 108; painful; 140 (PROM)  Shoulder extension    Shoulder abduction 146 99; painful; 120 (PROM)  Shoulder adduction    Shoulder internal rotation To T12 To L2; painful   Shoulder external rotation To T4 To T1; painful   Elbow flexion    Elbow extension    Wrist flexion    Wrist extension    Wrist ulnar deviation    Wrist radial deviation    Wrist pronation    Wrist supination    (Blank rows = not tested)  UPPER EXTREMITY MMT:  MMT Right eval Left eval  Shoulder flexion 4/5 4-/5  Shoulder extension    Shoulder abduction 4/5 4-/5  Shoulder adduction    Shoulder internal rotation 4+/5 4/5  Shoulder external rotation 4+/5 4/5  Middle trapezius    Lower trapezius    Elbow flexion    Elbow extension    Wrist flexion    Wrist extension    Wrist ulnar deviation    Wrist radial deviation    Wrist pronation    Wrist supination    Grip strength (lbs)    (Blank rows = not tested)  JOINT MOBILITY TESTING:  Left AC joint: hypomobile and familiar pain  PALPATION:  TTP: left clavicle and subscapularis insertion   TODAY'S TREATMENT:                                                                                                                                         DATE:  06/01/23 EXERCISE LOG  Exercise Repetitions and Resistance Comments  UBE X8 minutes @ 90 RPM    Resisted shoulder ADD  Green t-band x 3 minutes   Resisted shoulder ABD Green t-band x 3 minutes   Resisted shoulder flexion Green t-band x 3 minutes    Resisted pull down  Green t-band x 2.5 minutes    Blank cell = exercise not performed today                                    05/25/23 EXERCISE LOG  Exercise Repetitions and Resistance Comments  Pulleys 5 minutes   Resisted row Green t-band x 3 minutes   Resisted IR  Green t-band x 2 minutes   Resisted pull down  Green t-band x 2  minutes   L shoulder ADD isometric  2 minutes w/ 5 second hold   Resisted ER  Red t-band x 2 minutes    Blank cell = exercise not performed today  Manual Therapy Soft Tissue Mobilization: left bicep, supraspinatus, for reduced pain                                     05/11/23 EXERCISE LOG  Exercise Repetitions and Resistance Comments  Pulleys  5 minutes  Flexion  Cane press up  3# x 30 reps    Resisted row  Blue t-band x 2 minutes   Posterior lift off  20 reps    Wall push up  2 minutes   Functional IR stretch 2 minutes    Blank cell = exercise not performed today  Manual Therapy Soft Tissue Mobilization: left subscapularis and biceps, for reduced pain and tone  Joint Mobilizations: glenohumeral inferior, posterior, and lateral distraction, grade I-IV    PATIENT EDUCATION: Education details: benefits of exercise, activity modification, anatomy, healing, and progress with therapy Person educated: Patient Education method: Explanation Education comprehension: verbalized understanding  HOME EXERCISE PROGRAM: [id number]  ASSESSMENT:  CLINICAL IMPRESSION: Patient is making fair progress with skilled physical therapy as evidenced by her subjective reports, objective measures, functional mobility, and progress toward her goals. She was able to demonstrate improved left shoulder flexion since her initial evaluation on 04/18/23. However, she continues to experience increased left shoulder pain with functional use of her left upper extremity. Treatment focused on familiar interventions for improved shoulder mobility and rotator cuff strengthening. Her HEP was reviewed and she reported feeling comfortable with these interventions. She reported that her shoulder felt alright upon the conclusion of treatment. Recommend that she continue with her current plan of care to address her remaining impairments to maximize her functional mobility.   PHYSICAL THERAPY DISCHARGE SUMMARY  Visits from Start of  Care: 6  Current functional level related to goals / functional outcomes: Patient was able to partially meet her goals for skilled physical therapy. She is being discharged at this time due to not returning since her last visit.    Remaining deficits: Pain and AROM    Education / Equipment: HEP    Patient agrees to discharge. Patient goals were partially met. Patient is being discharged due to not returning since the last visit.  Glendora Landsman, PT, DPT    OBJECTIVE IMPAIRMENTS: decreased activity tolerance, decreased ROM, decreased strength, hypomobility, impaired UE functional use, and pain.   ACTIVITY LIMITATIONS: lifting and reach over head  PARTICIPATION LIMITATIONS: cleaning, shopping, and community activity  PERSONAL FACTORS: 3+ comorbidities: History of DVT, arthritis, diabetes, hypertension, history of breast cancer, and allergies  are also affecting patient's functional outcome.   REHAB POTENTIAL: Good  CLINICAL DECISION MAKING: Stable/uncomplicated  EVALUATION COMPLEXITY: Low   GOALS: Goals reviewed with patient? Yes  LONG TERM GOALS: Target date:  05/16/23  Patient will be independent with her home exercise program. Baseline:  Goal status: MET  2.  Patient will be able to complete her daily activities without her familiar pain exceeding 1/10. Baseline:  Goal status: IN PROGRESS  3.  Patient will be able to demonstrate at least 130 degrees of left shoulder flexion for improved function reaching overhead.  Baseline: 121 degrees on 06/01/23 Goal status: IN PROGRESS  4.  Patient will be able to sleep throughout the night without being awakened due to her familiar left shoulder symptoms.  Baseline: it no longer wakes her up at night Goal status: MET  5.  Patient will be able to demonstrate at least 120 degrees of left shoulder abduction for improved function reaching overhead.  Baseline: 95 degrees Goal status: IN PROGRESS  PLAN:  PT FREQUENCY:  2x/week  PT DURATION: 4 weeks  PLANNED INTERVENTIONS: Therapeutic exercises, Therapeutic activity, Neuromuscular re-education, Patient/Family education, Self Care, Joint mobilization, Electrical stimulation, Cryotherapy, Moist heat, Vasopneumatic device, Manual therapy, and Re-evaluation  PLAN FOR NEXT SESSION: pulleys, UBE, rotator cuff strengthening, supine cane flexion, and modalities as needed   Lane Pinon, PT 06/01/2023, 9:08 AM

## 2023-06-02 NOTE — Progress Notes (Signed)
   I, Stevenson Clinch, CMA acting as a scribe for Lisa Graham, MD.  Lisa Bradshaw is a 77 y.o. female who presents to [facility] today for f/u L shoulder pain. Pt was last seen by Dr. Denyse Amass on 04/11/23 and was referred to PT, completing 6 visits.  Today, pt reports worsening of left shoulder pain. Feels that activity from PT exacerbates sx. Feels that sx have worsened in severity and frequency.   Dx imaging: 04/11/23 L shoulder XR  Pertinent review of systems: No fevers or chills  Relevant historical information: History of breast cancer   Exam:  BP (!) 156/94   Pulse 72   Ht 5\' 7"  (1.702 m)   Wt 150 lb (68 kg)   SpO2 97%   BMI 23.49 kg/m  General: Well Developed, well nourished, and in no acute distress.   MSK: Left shoulder normal-appearing normal motion pain with abduction.    Lab and Radiology Results  EXAM: LEFT SHOULDER - 2+ VIEW   COMPARISON:  None Available.   FINDINGS: There is no evidence of fracture or dislocation. Moderate severity degenerative changes are seen involving the left acromioclavicular joint and left glenohumeral articulation. Soft tissues are unremarkable.   IMPRESSION: Moderate severity degenerative changes.     Electronically Signed   By: Aram Candela M.D.   On: 04/14/2023 23:12 I, Lisa Bradshaw, personally (independently) visualized and performed the interpretation of the images attached in this note.    Assessment and Plan: 77 y.o. female with left shoulder pain.  Pain thought to be primarily due to subacromial bursitis.  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  She had a good trial of PT which was unsuccessful over the last 2 months.  At this point after discussion we will proceed to MRI to further characterize next steps including potential injection or even surgery.   PDMP not reviewed this encounter. Orders Placed This Encounter  Procedures   MR SHOULDER LEFT WO  CONTRAST    Standing Status:   Future    Standing Expiration Date:   06/05/2024    Order Specific Question:   What is the patient's sedation requirement?    Answer:   No Sedation    Order Specific Question:   Does the patient have a pacemaker or implanted devices?    Answer:   No    Order Specific Question:   Preferred imaging location?    Answer:   [facility] (table limit-550lbs)   No orders of the defined types were placed in this encounter.    Discussed warning signs or symptoms. Please see discharge instructions. Patient expresses understanding.   The above documentation has been reviewed and is accurate and complete Lisa Bradshaw, M.D.

## 2023-06-06 ENCOUNTER — Ambulatory Visit: Payer: PPO | Admitting: Family Medicine

## 2023-06-06 ENCOUNTER — Encounter: Payer: Self-pay | Admitting: Family Medicine

## 2023-06-06 VITALS — BP 156/94 | HR 72 | Ht 67.0 in | Wt 150.0 lb

## 2023-06-06 DIAGNOSIS — M25512 Pain in left shoulder: Secondary | ICD-10-CM | POA: Diagnosis not present

## 2023-06-06 DIAGNOSIS — G8929 Other chronic pain: Secondary | ICD-10-CM

## 2023-06-06 NOTE — Patient Instructions (Signed)
Thank you for coming in today.   You should hear from MRI scheduling within 1 week. If you do not hear please let me know.    Once we get the results back I will likely ask you to return to talk about the MRI results and likely do an injection.

## 2023-06-08 DIAGNOSIS — E782 Mixed hyperlipidemia: Secondary | ICD-10-CM | POA: Diagnosis not present

## 2023-06-08 DIAGNOSIS — R252 Cramp and spasm: Secondary | ICD-10-CM | POA: Diagnosis not present

## 2023-06-08 DIAGNOSIS — M2550 Pain in unspecified joint: Secondary | ICD-10-CM | POA: Diagnosis not present

## 2023-06-08 DIAGNOSIS — I10 Essential (primary) hypertension: Secondary | ICD-10-CM | POA: Diagnosis not present

## 2023-06-08 DIAGNOSIS — E119 Type 2 diabetes mellitus without complications: Secondary | ICD-10-CM | POA: Diagnosis not present

## 2023-06-17 ENCOUNTER — Ambulatory Visit
Admission: RE | Admit: 2023-06-17 | Discharge: 2023-06-17 | Disposition: A | Discharge status: Home / Self Care | Payer: PPO | Source: Ambulatory Visit | Attending: Family Medicine | Admitting: Family Medicine

## 2023-06-17 DIAGNOSIS — M25512 Pain in left shoulder: Secondary | ICD-10-CM | POA: Diagnosis not present

## 2023-06-17 DIAGNOSIS — M19012 Primary osteoarthritis, left shoulder: Secondary | ICD-10-CM | POA: Diagnosis not present

## 2023-06-17 DIAGNOSIS — G8929 Other chronic pain: Secondary | ICD-10-CM

## 2023-06-17 DIAGNOSIS — M7582 Other shoulder lesions, left shoulder: Secondary | ICD-10-CM | POA: Diagnosis not present

## 2023-06-27 NOTE — Progress Notes (Signed)
Left shoulder MRI shows medium tendinitis and some rotator cuff fraying with a partial tear.  You do have some arthritis of the main shoulder joint as well.  Cortisone shot is a potentially good idea especially before you have surgery.  Recommend return to clinic to go over the results in full detail and potentially proceed with cortisone shot in the shoulder.

## 2023-08-24 DIAGNOSIS — H31001 Unspecified chorioretinal scars, right eye: Secondary | ICD-10-CM | POA: Diagnosis not present

## 2023-08-24 DIAGNOSIS — H5213 Myopia, bilateral: Secondary | ICD-10-CM | POA: Diagnosis not present

## 2023-08-24 DIAGNOSIS — E119 Type 2 diabetes mellitus without complications: Secondary | ICD-10-CM | POA: Diagnosis not present

## 2023-08-24 DIAGNOSIS — H2513 Age-related nuclear cataract, bilateral: Secondary | ICD-10-CM | POA: Diagnosis not present

## 2023-10-29 DIAGNOSIS — J069 Acute upper respiratory infection, unspecified: Secondary | ICD-10-CM | POA: Diagnosis not present

## 2023-10-29 DIAGNOSIS — R030 Elevated blood-pressure reading, without diagnosis of hypertension: Secondary | ICD-10-CM | POA: Diagnosis not present

## 2024-01-27 ENCOUNTER — Ambulatory Visit: Payer: PPO | Attending: Cardiology | Admitting: Cardiology

## 2024-01-27 ENCOUNTER — Encounter: Payer: Self-pay | Admitting: Cardiology

## 2024-01-27 VITALS — BP 148/72 | HR 70 | Ht 67.0 in | Wt 151.8 lb

## 2024-01-27 DIAGNOSIS — R072 Precordial pain: Secondary | ICD-10-CM

## 2024-01-27 DIAGNOSIS — E119 Type 2 diabetes mellitus without complications: Secondary | ICD-10-CM | POA: Diagnosis not present

## 2024-01-27 DIAGNOSIS — I10 Essential (primary) hypertension: Secondary | ICD-10-CM | POA: Diagnosis not present

## 2024-01-27 DIAGNOSIS — R011 Cardiac murmur, unspecified: Secondary | ICD-10-CM

## 2024-01-27 DIAGNOSIS — I350 Nonrheumatic aortic (valve) stenosis: Secondary | ICD-10-CM

## 2024-01-27 NOTE — Patient Instructions (Signed)
 Testing/Procedures: Cardiac PET scan  Follow-Up: At [facility], you and your health needs are our priority.  As part of our continuing mission to provide you with exceptional heart care, we have created designated Provider Care Teams.  These Care Teams include your primary Cardiologist (physician) and Advanced Practice Providers (APPs -  Physician Assistants and Nurse Practitioners) who all work together to provide you with the care you need, when you need it.  Your next appointment:   1 year(s)  Provider:   Donato Schultz, MD     Other Instructions    Please report to Radiology at the [facility] Main Entrance 30 minutes early for your test.  [address] [place], [place] [address]                         OR   Please report to Radiology at [facility] Main Entrance, medical mall, 30 mins prior to your test.  [address]  [place], [place]  How to Prepare for Your Cardiac PET/CT Stress Test:  Nothing to eat or drink, except water, 3 hours prior to arrival time.  NO caffeine/decaffeinated products, or chocolate 12 hours prior to arrival. (Please note decaffeinated beverages (teas/coffees) still contain caffeine).  If you have caffeine within 12 hours prior, the test will need to be rescheduled.  Medication instructions: Do not take erectile dysfunction medications for 72 hours prior to test (sildenafil, tadalafil) Do not take nitrates (isosorbide mononitrate, Ranexa) the day before or day of test Do not take tamsulosin the day before or morning of test Hold theophylline containing medications for 12 hours. Hold Dipyridamole 48 hours prior to the test.  Diabetic Preparation: If able to eat breakfast prior to 3 hour fasting, you may take all medications, including your insulin. Do not worry if you miss your breakfast dose of insulin - start at your next meal. If you do not eat prior to 3 hour fast-Hold all diabetes (oral and  insulin) medications. Patients who wear a continuous glucose monitor MUST remove the device prior to scanning.  You may take your remaining medications with water.  NO perfume, cologne or lotion on chest or abdomen area. FEMALES - Please avoid wearing dresses to this appointment.  Total time is 1 to 2 hours; you may want to bring reading material for the waiting time.  IF YOU THINK YOU MAY BE PREGNANT, OR ARE NURSING PLEASE INFORM THE TECHNOLOGIST.  In preparation for your appointment, medication and supplies will be purchased.  Appointment availability is limited, so if you need to cancel or reschedule, please call the Radiology Department Scheduler at [phone] 24 hours in advance to avoid a cancellation fee of $100.00  What to Expect When you Arrive:  Once you arrive and check in for your appointment, you will be taken to a preparation room within the Radiology Department.  A technologist or Nurse will obtain your medical history, verify that you are correctly prepped for the exam, and explain the procedure.  Afterwards, an IV will be started in your arm and electrodes will be placed on your skin for EKG monitoring during the stress portion of the exam. Then you will be escorted to the PET/CT scanner.  There, staff will get you positioned on the scanner and obtain a blood pressure and EKG.  During the exam, you will continue to be connected to the EKG and blood pressure machines.  A small, safe amount  of a radioactive tracer will be injected in your IV to obtain a series of pictures of your heart along with an injection of a stress agent.    After your Exam:  It is recommended that you eat a meal and drink a caffeinated beverage to counter act any effects of the stress agent.  Drink plenty of fluids for the remainder of the day and urinate frequently for the first couple of hours after the exam.  Your doctor will inform you of your test results within 7-10 business days.  For more  information and frequently asked questions, please visit our website: [email or web address]  For questions about your test or how to prepare for your test, please call: Cardiac Imaging Nurse Navigators Office: [phone]

## 2024-01-27 NOTE — Progress Notes (Signed)
 Cardiology Office Note:  .   Date:  01/27/2024  ID:  Lisa Bradshaw, DOB 1946-10-04, MRN [id number] PCP: Lisa Brunette, MD  [facility] Providers Cardiologist:  Lisa Schultz, MD     History of Present Illness: .   Lisa Bradshaw is a 78 y.o. female Discussed the use of AI scribe software for clinical note transcription with the patient, who gave verbal consent to proceed.  History of Present Illness Lisa Bradshaw "Lisa Bradshaw" is a 78 year old female with aortic stenosis who presents with chest pain and heaviness.  She experiences occasional chest pain and a sensation of heaviness, particularly when standing for extended periods, such as while switching out a sprinkler or ironing. These symptoms resolve upon sitting down but have intensified over time. A nuclear stress test in 2023 was low risk with no ischemia. An echocardiogram on June 16, 2022, showed mild aortic valve stenosis with a mean gradient of 11 mmHg. She has a systolic murmur and a small pericardial effusion being treated conservatively. Her ejection fraction is 60%.  She experiences symptoms of fluttering associated with premature atrial contractions noted on her ECG. Her heart rate can reach 90 to 100 bpm when standing, which she monitors using her watch.  Her past medical history includes diabetes, with a recent hemoglobin A1c of 6.7, and hyperlipidemia, for which she takes Vytorin 10/40 mg daily. She is on lisinopril hydrochlorothiazide 12/12.5 mg daily for hypertension and takes aspirin 81 mg daily for prevention. Her LDL was recently checked at 68 mg/dL, and her hemoglobin is 13.6 g/dL with no anemia. She feels cold in her hands and feet more than she used to, which she attributes to aging.  Socially, she remains active, working in her garden and mowing her yard, although she notes increased fatigue and the need to take breaks more frequently. 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 She acknowledges some cognitive slowing, referring to it as a 'ten second thing' when recalling names.     Studies Reviewed: Marland Kitchen   EKG Interpretation Date/Time:  Friday January 27 2024 08:37:56 EDT Ventricular Rate:  70 PR Interval:  176 QRS Duration:  78 QT Interval:  388 QTC Calculation: 419 R Axis:   74  Text Interpretation: Sinus rhythm with Premature atrial complexes When compared with ECG of 20-Feb-2014 09:26, No significant change was found Confirmed by Lisa Bradshaw ([id number]) on 01/27/2024 8:44:31 AM    Results LABS LDL: 68 (12/28/2023) HbA1c: 6.3 (12/28/2023) Hb: 13.6 (12/28/2023) TSH: 1.3 (12/28/2023) ALT: 15 (12/28/2023)  DIAGNOSTIC Nuclear stress test: Low risk with no ischemia (2023) Echocardiogram: Mild aortic valve stenosis with mean gradient of 11 mmHg (06/16/2022) ECG: Premature atrial contractions Risk Assessment/Calculations:           Physical Exam:   VS:  BP (!) 148/72   Pulse 70   Ht 5\' 7"  (1.702 m)   Wt 151 lb 12.8 oz (68.9 kg)   SpO2 97%   BMI 23.78 kg/m    Wt Readings from Last 3 Encounters:  01/27/24 151 lb 12.8 oz (68.9 kg)  06/06/23 150 lb (68 kg)  04/11/23 155 lb (70.3 kg)    GEN: Well nourished, well developed in no acute distress NECK: No JVD; No carotid bruits CARDIAC: RRR, 2/6 SM, no rubs, no gallops RESPIRATORY:  Clear to auscultation without rales, wheezing or rhonchi  ABDOMEN: Soft, non-tender, non-distended EXTREMITIES:  No edema; No deformity  ASSESSMENT AND PLAN: .    Assessment and Plan Assessment & Plan Aortic valve stenosis Mild aortic valve stenosis with a mean gradient of 11 mmHg. Systolic murmur present on exam. Ejection fraction is 60%. No significant progression since last evaluation. Conservative management is appropriate at this time. - Schedule follow-up in one year for valve assessment. Consider echocardiogram next year or the year after unless symptoms change.  Chest pain Intermittent  chest pain and heaviness, possibly exertional. Previous nuclear stress test in 2023 was low risk with no ischemia. Symptoms may relate to aging or underlying cardiac issues. Given diabetes and hypertension, further evaluation is warranted. A PET stress test is chosen for its ability to provide clearer imaging and assess blood flow, especially given the presence of PACs which can interfere with CT imaging. - Order cardiac PET stress test to evaluate blood flow and assess for high-risk patterns.  Premature atrial contractions (PACs) Premature atrial contractions noted on ECG, causing symptoms of fluttering. Considered benign at this time. PACs may interfere with certain imaging modalities, hence the choice of PET stress test over CT scan.  Pericardial effusion Small pericardial effusion being managed conservatively. No acute intervention required at this time.  Diabetes mellitus with coincident hypertension Diabetes and hypertension are well controlled. Hemoglobin A1c is 6.3, down from 6.4. Current medication regimen includes metformin and lisinopril-hydrochlorothiazide.  Hyperlipidemia Hyperlipidemia managed with Vytorin. Recent LDL level is 68 mg/dL, indicating good control.  Follow-up Follow-up plan to monitor aortic valve stenosis and overall cardiac health. - Schedule follow-up appointment in one year unless symptoms significantly worsen.       Informed Consent   Shared Decision Making/Informed Consent The risks [chest pain, shortness of breath, cardiac arrhythmias, dizziness, blood pressure fluctuations, myocardial infarction, stroke/transient ischemic attack, nausea, vomiting, allergic reaction, radiation exposure, metallic taste sensation and life-threatening complications (estimated to be 1 in 10,000)], benefits (risk stratification, diagnosing coronary artery disease, treatment guidance) and alternatives of a cardiac PET stress test were discussed in detail with Lisa Bradshaw and she  agrees to proceed.       Signed, Lisa Schultz, MD

## 2024-02-20 DIAGNOSIS — D3614 Benign neoplasm of peripheral nerves and autonomic nervous system of thorax: Secondary | ICD-10-CM | POA: Diagnosis not present

## 2024-02-20 DIAGNOSIS — D225 Melanocytic nevi of trunk: Secondary | ICD-10-CM | POA: Diagnosis not present

## 2024-02-20 DIAGNOSIS — D1801 Hemangioma of skin and subcutaneous tissue: Secondary | ICD-10-CM | POA: Diagnosis not present

## 2024-02-20 DIAGNOSIS — D2230 Melanocytic nevi of unspecified part of face: Secondary | ICD-10-CM | POA: Diagnosis not present

## 2024-02-20 DIAGNOSIS — L578 Other skin changes due to chronic exposure to nonionizing radiation: Secondary | ICD-10-CM | POA: Diagnosis not present

## 2024-02-20 DIAGNOSIS — L821 Other seborrheic keratosis: Secondary | ICD-10-CM | POA: Diagnosis not present

## 2024-02-20 DIAGNOSIS — D2272 Melanocytic nevi of left lower limb, including hip: Secondary | ICD-10-CM | POA: Diagnosis not present

## 2024-02-20 DIAGNOSIS — L814 Other melanin hyperpigmentation: Secondary | ICD-10-CM | POA: Diagnosis not present

## 2024-02-20 DIAGNOSIS — D485 Neoplasm of uncertain behavior of skin: Secondary | ICD-10-CM | POA: Diagnosis not present

## 2024-02-20 DIAGNOSIS — R229 Localized swelling, mass and lump, unspecified: Secondary | ICD-10-CM | POA: Diagnosis not present

## 2024-02-20 DIAGNOSIS — D3617 Benign neoplasm of peripheral nerves and autonomic nervous system of trunk, unspecified: Secondary | ICD-10-CM | POA: Diagnosis not present

## 2024-04-07 DIAGNOSIS — I340 Nonrheumatic mitral (valve) insufficiency: Secondary | ICD-10-CM | POA: Diagnosis not present

## 2024-04-07 DIAGNOSIS — E119 Type 2 diabetes mellitus without complications: Secondary | ICD-10-CM | POA: Diagnosis not present

## 2024-04-07 DIAGNOSIS — E1169 Type 2 diabetes mellitus with other specified complication: Secondary | ICD-10-CM | POA: Diagnosis not present

## 2024-04-07 DIAGNOSIS — I10 Essential (primary) hypertension: Secondary | ICD-10-CM | POA: Diagnosis not present

## 2024-04-16 ENCOUNTER — Encounter (HOSPITAL_COMMUNITY): Payer: Self-pay

## 2024-04-17 DIAGNOSIS — I10 Essential (primary) hypertension: Secondary | ICD-10-CM | POA: Diagnosis not present

## 2024-04-17 DIAGNOSIS — E119 Type 2 diabetes mellitus without complications: Secondary | ICD-10-CM | POA: Diagnosis not present

## 2024-04-17 DIAGNOSIS — I340 Nonrheumatic mitral (valve) insufficiency: Secondary | ICD-10-CM | POA: Diagnosis not present

## 2024-04-17 DIAGNOSIS — E1169 Type 2 diabetes mellitus with other specified complication: Secondary | ICD-10-CM | POA: Diagnosis not present

## 2024-04-18 ENCOUNTER — Encounter (HOSPITAL_COMMUNITY)
Admission: RE | Admit: 2024-04-18 | Discharge: 2024-04-18 | Disposition: A | Discharge status: Home / Self Care | Source: Ambulatory Visit | Attending: Cardiology | Admitting: Cardiology

## 2024-04-18 DIAGNOSIS — I350 Nonrheumatic aortic (valve) stenosis: Secondary | ICD-10-CM | POA: Diagnosis not present

## 2024-04-18 DIAGNOSIS — E119 Type 2 diabetes mellitus without complications: Secondary | ICD-10-CM | POA: Diagnosis not present

## 2024-04-18 DIAGNOSIS — I10 Essential (primary) hypertension: Secondary | ICD-10-CM | POA: Diagnosis not present

## 2024-04-18 DIAGNOSIS — R011 Cardiac murmur, unspecified: Secondary | ICD-10-CM | POA: Insufficient documentation

## 2024-04-18 DIAGNOSIS — R072 Precordial pain: Secondary | ICD-10-CM | POA: Insufficient documentation

## 2024-04-18 LAB — NM PET CT CARDIAC PERFUSION MULTI W/ABSOLUTE BLOODFLOW
LV dias vol: 71 mL (ref 46–106)
LV sys vol: 32 mL
MBFR: 2.19
Nuc Rest EF: 55 %
Nuc Stress EF: 68 %
Rest MBF: 1.06 ml/g/min
Rest Nuclear Isotope Dose: 17.7 mCi
ST Depression (mm): 0 mm
Stress MBF: 2.32 ml/g/min
Stress Nuclear Isotope Dose: 17.9 mCi

## 2024-04-18 MED ORDER — RUBIDIUM RB82 GENERATOR (RUBYFILL)
17.7100 | PACK | Freq: Once | INTRAVENOUS | Status: AC
  Administered 2024-04-18: 17.71 via INTRAVENOUS

## 2024-04-18 MED ORDER — REGADENOSON 0.4 MG/5ML IV SOLN
INTRAVENOUS | Status: AC
  Filled 2024-04-18: qty 5

## 2024-04-18 MED ORDER — REGADENOSON 0.4 MG/5ML IV SOLN
0.4000 mg | Freq: Once | INTRAVENOUS | Status: AC
  Administered 2024-04-18: 0.4 mg via INTRAVENOUS

## 2024-04-18 MED ORDER — RUBIDIUM RB82 GENERATOR (RUBYFILL)
17.9100 | PACK | Freq: Once | INTRAVENOUS | Status: AC
  Administered 2024-04-18: 17.91 via INTRAVENOUS

## 2024-04-18 NOTE — Progress Notes (Signed)
 Pt. Tolerated lexi scan well.

## 2024-04-19 ENCOUNTER — Ambulatory Visit: Payer: Self-pay | Admitting: Cardiology

## 2024-04-19 DIAGNOSIS — R911 Solitary pulmonary nodule: Secondary | ICD-10-CM

## 2024-04-23 NOTE — Telephone Encounter (Signed)
  Pt is calling to get more info about her results

## 2024-04-26 NOTE — Progress Notes (Signed)
 Thank you. Let's go ahead and refer her to pulmonary to review nodule.  Dorothye Gathers, MD

## 2024-05-07 DIAGNOSIS — I10 Essential (primary) hypertension: Secondary | ICD-10-CM | POA: Diagnosis not present

## 2024-05-07 DIAGNOSIS — I340 Nonrheumatic mitral (valve) insufficiency: Secondary | ICD-10-CM | POA: Diagnosis not present

## 2024-05-07 DIAGNOSIS — E1169 Type 2 diabetes mellitus with other specified complication: Secondary | ICD-10-CM | POA: Diagnosis not present

## 2024-05-07 DIAGNOSIS — E119 Type 2 diabetes mellitus without complications: Secondary | ICD-10-CM | POA: Diagnosis not present

## 2024-05-16 DIAGNOSIS — I340 Nonrheumatic mitral (valve) insufficiency: Secondary | ICD-10-CM | POA: Diagnosis not present

## 2024-05-16 DIAGNOSIS — E119 Type 2 diabetes mellitus without complications: Secondary | ICD-10-CM | POA: Diagnosis not present

## 2024-05-16 DIAGNOSIS — E1169 Type 2 diabetes mellitus with other specified complication: Secondary | ICD-10-CM | POA: Diagnosis not present

## 2024-05-16 DIAGNOSIS — I10 Essential (primary) hypertension: Secondary | ICD-10-CM | POA: Diagnosis not present

## 2024-05-21 DIAGNOSIS — R229 Localized swelling, mass and lump, unspecified: Secondary | ICD-10-CM | POA: Diagnosis not present

## 2024-05-21 DIAGNOSIS — L299 Pruritus, unspecified: Secondary | ICD-10-CM | POA: Diagnosis not present

## 2024-05-21 DIAGNOSIS — D2230 Melanocytic nevi of unspecified part of face: Secondary | ICD-10-CM | POA: Diagnosis not present

## 2024-05-21 DIAGNOSIS — D1801 Hemangioma of skin and subcutaneous tissue: Secondary | ICD-10-CM | POA: Diagnosis not present

## 2024-05-29 ENCOUNTER — Encounter: Payer: Self-pay | Admitting: Acute Care

## 2024-05-29 ENCOUNTER — Encounter: Payer: Self-pay | Admitting: Emergency Medicine

## 2024-05-29 ENCOUNTER — Telehealth: Payer: Self-pay | Admitting: Acute Care

## 2024-05-29 ENCOUNTER — Ambulatory Visit: Admitting: Acute Care

## 2024-05-29 VITALS — BP 140/72 | HR 79 | Ht 67.0 in | Wt 147.2 lb

## 2024-05-29 DIAGNOSIS — Z853 Personal history of malignant neoplasm of breast: Secondary | ICD-10-CM | POA: Diagnosis not present

## 2024-05-29 DIAGNOSIS — Z86718 Personal history of other venous thrombosis and embolism: Secondary | ICD-10-CM

## 2024-05-29 DIAGNOSIS — E119 Type 2 diabetes mellitus without complications: Secondary | ICD-10-CM | POA: Diagnosis not present

## 2024-05-29 DIAGNOSIS — R911 Solitary pulmonary nodule: Secondary | ICD-10-CM

## 2024-05-29 DIAGNOSIS — Z789 Other specified health status: Secondary | ICD-10-CM | POA: Diagnosis not present

## 2024-05-29 NOTE — Progress Notes (Addendum)
 History of Present Illness Lisa Bradshaw is a 78 y.o. female never smoker referred for lung nodule consult after incidental finding of a 1.7 cm spiculated pulmonary nodule noted on a cardiac PET scan.  She will be followed by Dr. Shelah.  Pt. Has consented to use of [employer] soft wear to help capture the content of this OV .  05/29/2024 Lisa Bradshaw is an 78 year old female never smoker who presents with an incidental  lung nodule found on a cardiac PET scan.  A 1.7 cm spiculated nodule was incidentally discovered in the right middle lobe of her lung during a cardiac PET scan. The nodule is described as suspicious on imaging. No breathing issues are reported.  She has a significant past medical history of breast cancer, having undergone treatment in 2005 and 2007, with no recurrence since then. She manages her diabetes well with metformin, maintaining her HbA1c under 7 for the past 15 years. She also has a history of high blood pressure, which is under control, and a slight heart murmur.  She denies any history of smoking and reports no exposure to occupational hazards, though she was exposed to secondhand smoke as a child due to her mother's smoking. Her family history is notable for cancer, as her mother had ovarian cancer and another unspecified cancer. Her mother passed away in her late sixties or early seventies from a non-cancer-related cause.  She lives alone, is retired, and has adult children. She does not consume alcohol and has no known allergies. She is currently taking a baby aspirin daily.  We discussed options of a PET scan then Biopsy vs biopsy now, and pt. prefers biopsy now. Plan will be for navigational bronchoscopy with biopsies. The initial Cardiac PET was done 04/18/2024. We will do a Super D CT chest for updated images to plan navigation.   Test Results: Cardiac PET scan 04/18/2024 1.7 cm spiculated nodule is seen in the right middle lobe on image 24/4. This is  suspicious for bronchogenic carcinoma. No pleural fluid seen.   The visualized portions of the mediastinum and chest wall are unremarkable. 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   IMPRESSION: 1.7 cm spiculated nodule in the right middle lobe, suspicious for bronchogenic carcinoma. Complete FDG PET-CT is recommended for further evaluation.    Latest Ref Rng & Units 06/30/2015   12:50 PM 02/25/2014    8:06 AM  CBC  WBC 4.0 - 10.5 K/uL 8.6    Hemoglobin 12.0 - 15.0 g/dL 84.4  85.9   Hematocrit 36.0 - 46.0 % 45.2    Platelets 150 - 400 K/uL 187         Latest Ref Rng & Units 06/30/2015   12:50 PM 02/20/2014    1:59 PM  BMP  Glucose 65 - 99 mg/dL 863  891   BUN 6 - 20 mg/dL 19  14   Creatinine 9.55 - 1.00 mg/dL 9.11  9.24   Sodium 864 - 145 mmol/L 135  142   Potassium 3.5 - 5.1 mmol/L 3.6  4.7   Chloride 101 - 111 mmol/L 98  105   CO2 22 - 32 mmol/L 27  27   Calcium 8.9 - 10.3 mg/dL 9.9  89.9     BNP No results found for: BNP  ProBNP No results found for: PROBNP  PFT No results found for: FEV1PRE, FEV1POST, FVCPRE, FVCPOST, TLC, DLCOUNC, PREFEV1FVCRT, PSTFEV1FVCRT  No results found.   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 > 20 yr.   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 breast [facility])    Mass of finger of right hand 02/06/2014   thumb   Mild concentric left ventricular hypertrophy    Mitral valve regurgitation     Non-insulin dependent type 2 diabetes mellitus (HCC)    Osteoarthritis of hand    Pericardial effusion (noninflammatory) 11/08/2000   history of  - no longer sees cardiologist   Seasonal allergies      Social History   Tobacco Use   Smoking status: Never   Smokeless tobacco: Never  Substance Use Topics   Alcohol use: No   Drug use: No    [name] reports that she has never smoked. She has never used smokeless tobacco. She reports that she does not drink alcohol and does not use drugs.  Tobacco Cessation: Counseling given: Not Answered Never smoker   Past surgical hx, Family hx, Social hx all reviewed.  Current Outpatient Medications on File Prior to Visit  Medication Sig   aspirin 81 MG tablet Take 81 mg by mouth daily.   ezetimibe-simvastatin (VYTORIN) 10-40 MG per tablet Take 1 tablet by mouth daily.   lisinopril-hydrochlorothiazide (ZESTORETIC) 20-12.5 MG tablet Take 1 tablet by mouth daily.   loratadine (CLARITIN) 10 MG tablet Take 10 mg by mouth daily as needed for allergies.    metFORMIN (GLUCOPHAGE) 500 MG tablet Take 500 mg by mouth 2 (two) times daily with a meal.   Multiple Vitamin (MULTIVITAMIN) tablet Take 1 tablet by mouth daily.   No current facility-administered medications on file prior to visit.     No Known Allergies  Review Of Systems:  Constitutional:   No  weight loss, night sweats,  Fevers, chills, fatigue, or  lassitude.  HEENT:   No headaches,  Difficulty swallowing,  Tooth/dental problems, or  Sore throat,                No sneezing, itching, ear ache, nasal congestion, post nasal drip,   CV:  No chest pain,  Orthopnea, PND, swelling in lower extremities, anasarca, dizziness, palpitations, syncope.   GI  No heartburn, indigestion, abdominal pain, nausea, vomiting, diarrhea, change in bowel habits, loss of appetite, bloody stools.   Resp: No shortness of breath with exertion or at rest.  No excess mucus, no productive cough,  No  non-productive cough,  No coughing up of blood.  No change in color of mucus.  No wheezing.  No chest wall deformity  Skin: no rash or lesions.  GU: no dysuria, change in color of urine, no urgency or frequency.  No flank pain, no hematuria   MS:  No joint pain or swelling.  No decreased range of motion.  No back pain.  Psych:  No change in mood or affect. No depression  or anxiety.  No memory loss.   Vital Signs BP (!) 140/72 (BP Location: Left Arm, Patient Position: Sitting, Cuff Size: Normal)   Pulse 79   Ht 5' 7 (1.702 m)   Wt 147 lb 3.2 oz (66.8 kg)   SpO2 99%   BMI 23.05 kg/m    Physical Exam:  General- No distress,  A&Ox3, pleasant ENT: No sinus tenderness, TM clear, pale nasal mucosa, no oral exudate,no post nasal drip, no LAN Cardiac: S1, S2, regular rate and rhythm, no murmur Chest: No wheeze/ rales/ dullness; no accessory muscle use, no nasal flaring, no sternal retractions Abd.: Soft Non-tender, ND, BS +, Body mass index is 23.05 kg/m.  Ext: No clubbing cyanosis, edema, no obvious deformities Neuro:  normal strength, MAE x 4, A&O x 3 Skin: No rashes, warm and dry, no obvious skin lesions  Psych: normal mood and behavior   Assessment/Plan Incidental 1.7 cm spiculated nodule in the right middle lobe noted on cardiac PET Never smoker History of breast cancer Plan We have reviewed you chest images. We looked at the lung nodule together. We discussed a PET scan the Biopsy vs biopsy now, and you prefer biopsy now. I have placed an order for a bronchoscopy with biopsies.  We have discussed the procedure in detail.  We have reviewed the risks and benefits of the procedure. These include bleeding, infection, puncture of the lung, and adverse reaction to anesthesia. You have agreed to proceed with biopsy to evaluate the right lobe lung nodule. Your procedure will be done by Dr. Lamar Chris. You will receive a letter today with date time and information pertaining  to the procedure. You will need someone to drive you to the procedure, stay with you during the procedure, and stay with you after the procedure. You will also need someone to stay with you for 24 hours after anesthesia to ensure you have cleared and are doing well. You will follow-up with me 1 week after the procedure to review the results and to ensure you are doing well. Call if you need us  prior to the procedure or if you have any questions at all. Please contact office for sooner follow up if symptoms do not improve or worsen or seek emergency care   I have also ordered a CT Chest for planning of the procedure.   You will get a call to get this scheduled.  History of Breast cancer History of breast cancer in 2005 and 2007 with no recurrence for nearly 20 years.  Family history of cancer in mother. Increases risk of lung nodule being malignant.  Type 2 diabetes mellitus, well controlled Well controlled with metformin. Recent HbA1c 6.4%.  History of blood clots post-hysterectomy Blood clots post-hysterectomy, previously associated with hormone therapy.  Currently on baby aspirin.  I spent 40 minutes dedicated to the care of this patient on the date of this encounter to include pre-visit review of records, face-to-face time with the patient discussing conditions above, post visit ordering of testing, clinical documentation with the electronic health record, making appropriate referrals as documented, and communicating necessary information to the patient's healthcare team.    Lauraine JULIANNA Lites, NP 05/29/2024  8:38 AM

## 2024-05-29 NOTE — Patient Instructions (Signed)
 It is good to see you today. We have reviewed you chest images. We looked at the lung nodule together. We discussed a PET scan the Biopsy vs biopsy now, and you prefer biopsy now. I have placed an order for a bronchoscopy with biopsies.  We have discussed the procedure in detail.  We have reviewed the risks and benefits of the procedure. These include bleeding, infection, puncture of the lung, and adverse reaction to anesthesia. You have agreed to proceed with biopsy to evaluate the right lobe lung nodule. Your procedure will be done by Dr. Lamar Chris. You will receive a letter today with date time and information pertaining to the procedure. You will need someone to drive you to the procedure, stay with you during the procedure, and stay with you after the procedure. You will also need someone to stay with you for 24 hours after anesthesia to ensure you have cleared and are doing well. You will follow-up with me 1 week after the procedure to review the results and to ensure you are doing well. Call if you need us  prior to the procedure or if you have any questions at all. Please contact office for sooner follow up if symptoms do not improve or worsen or seek emergency care   I have also ordered a CT Chest for planning of he procedure.

## 2024-05-29 NOTE — H&P (View-Only) (Signed)
 History of Present Illness Lisa Bradshaw is a 78 y.o. female never smoker referred for lung nodule consult after incidental finding of a 1.7 cm spiculated pulmonary nodule noted on a cardiac PET scan.  She will be followed by Dr. Shelah.  Pt. Has consented to use of [employer] soft wear to help capture the content of this OV .  05/29/2024 Lisa Bradshaw is an 78 year old female never smoker who presents with an incidental  lung nodule found on a cardiac PET scan.  A 1.7 cm spiculated nodule was incidentally discovered in the right middle lobe of her lung during a cardiac PET scan. The nodule is described as suspicious on imaging. No breathing issues are reported.  She has a significant past medical history of breast cancer, having undergone treatment in 2005 and 2007, with no recurrence since then. She manages her diabetes well with metformin, maintaining her HbA1c under 7 for the past 15 years. She also has a history of high blood pressure, which is under control, and a slight heart murmur.  She denies any history of smoking and reports no exposure to occupational hazards, though she was exposed to secondhand smoke as a child due to her mother's smoking. Her family history is notable for cancer, as her mother had ovarian cancer and another unspecified cancer. Her mother passed away in her late sixties or early seventies from a non-cancer-related cause.  She lives alone, is retired, and has adult children. She does not consume alcohol and has no known allergies. She is currently taking a baby aspirin daily.  We discussed options of a PET scan then Biopsy vs biopsy now, and pt. prefers biopsy now. Plan will be for navigational bronchoscopy with biopsies. The initial Cardiac PET was done 04/18/2024. We will do a Super D CT chest for updated images to plan navigation.   Test Results: Cardiac PET scan 04/18/2024 1.7 cm spiculated nodule is seen in the right middle lobe on image 24/4. This is  suspicious for bronchogenic carcinoma. No pleural fluid seen.   The visualized portions of the mediastinum and chest wall are unremarkable. 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   IMPRESSION: 1.7 cm spiculated nodule in the right middle lobe, suspicious for bronchogenic carcinoma. Complete FDG PET-CT is recommended for further evaluation.    Latest Ref Rng & Units 06/30/2015   12:50 PM 02/25/2014    8:06 AM  CBC  WBC 4.0 - 10.5 K/uL 8.6    Hemoglobin 12.0 - 15.0 g/dL 84.4  85.9   Hematocrit 36.0 - 46.0 % 45.2    Platelets 150 - 400 K/uL 187         Latest Ref Rng & Units 06/30/2015   12:50 PM 02/20/2014    1:59 PM  BMP  Glucose 65 - 99 mg/dL 863  891   BUN 6 - 20 mg/dL 19  14   Creatinine 9.55 - 1.00 mg/dL 9.11  9.24   Sodium 864 - 145 mmol/L 135  142   Potassium 3.5 - 5.1 mmol/L 3.6  4.7   Chloride 101 - 111 mmol/L 98  105   CO2 22 - 32 mmol/L 27  27   Calcium 8.9 - 10.3 mg/dL 9.9  89.9     BNP No results found for: BNP  ProBNP No results found for: PROBNP  PFT No results found for: FEV1PRE, FEV1POST, FVCPRE, FVCPOST, TLC, DLCOUNC, PREFEV1FVCRT, PSTFEV1FVCRT  No results found.   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 > 20 yr.   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 breast [facility])    Mass of finger of right hand 02/06/2014   thumb   Mild concentric left ventricular hypertrophy    Mitral valve regurgitation     Non-insulin dependent type 2 diabetes mellitus (HCC)    Osteoarthritis of hand    Pericardial effusion (noninflammatory) 11/08/2000   history of  - no longer sees cardiologist   Seasonal allergies      Social History   Tobacco Use   Smoking status: Never   Smokeless tobacco: Never  Substance Use Topics   Alcohol use: No   Drug use: No    Lisa Bradshaw reports that she has never smoked. She has never used smokeless tobacco. She reports that she does not drink alcohol and does not use drugs.  Tobacco Cessation: Counseling given: Not Answered Never smoker   Past surgical hx, Family hx, Social hx all reviewed.  Current Outpatient Medications on File Prior to Visit  Medication Sig   aspirin 81 MG tablet Take 81 mg by mouth daily.   ezetimibe-simvastatin (VYTORIN) 10-40 MG per tablet Take 1 tablet by mouth daily.   lisinopril-hydrochlorothiazide (ZESTORETIC) 20-12.5 MG tablet Take 1 tablet by mouth daily.   loratadine (CLARITIN) 10 MG tablet Take 10 mg by mouth daily as needed for allergies.    metFORMIN (GLUCOPHAGE) 500 MG tablet Take 500 mg by mouth 2 (two) times daily with a meal.   Multiple Vitamin (MULTIVITAMIN) tablet Take 1 tablet by mouth daily.   No current facility-administered medications on file prior to visit.     No Known Allergies  Review Of Systems:  Constitutional:   No  weight loss, night sweats,  Fevers, chills, fatigue, or  lassitude.  HEENT:   No headaches,  Difficulty swallowing,  Tooth/dental problems, or  Sore throat,                No sneezing, itching, ear ache, nasal congestion, post nasal drip,   CV:  No chest pain,  Orthopnea, PND, swelling in lower extremities, anasarca, dizziness, palpitations, syncope.   GI  No heartburn, indigestion, abdominal pain, nausea, vomiting, diarrhea, change in bowel habits, loss of appetite, bloody stools.   Resp: No shortness of breath with exertion or at rest.  No excess mucus, no productive cough,  No  non-productive cough,  No coughing up of blood.  No change in color of mucus.  No wheezing.  No chest wall deformity  Skin: no rash or lesions.  GU: no dysuria, change in color of urine, no urgency or frequency.  No flank pain, no hematuria   MS:  No joint pain or swelling.  No decreased range of motion.  No back pain.  Psych:  No change in mood or affect. No depression  or anxiety.  No memory loss.   Vital Signs BP (!) 140/72 (BP Location: Left Arm, Patient Position: Sitting, Cuff Size: Normal)   Pulse 79   Ht 5' 7 (1.702 m)   Wt 147 lb 3.2 oz (66.8 kg)   SpO2 99%   BMI 23.05 kg/m    Physical Exam:  General- No distress,  A&Ox3, pleasant ENT: No sinus tenderness, TM clear, pale nasal mucosa, no oral exudate,no post nasal drip, no LAN Cardiac: S1, S2, regular rate and rhythm, no murmur Chest: No wheeze/ rales/ dullness; no accessory muscle use, no nasal flaring, no sternal retractions Abd.: Soft Non-tender, ND, BS +, Body mass index is 23.05 kg/m.  Ext: No clubbing cyanosis, edema, no obvious deformities Neuro:  normal strength, MAE x 4, A&O x 3 Skin: No rashes, warm and dry, no obvious skin lesions  Psych: normal mood and behavior   Assessment/Plan Incidental 1.7 cm spiculated nodule in the right middle lobe noted on cardiac PET Never smoker History of breast cancer Plan We have reviewed you chest images. We looked at the lung nodule together. We discussed a PET scan the Biopsy vs biopsy now, and you prefer biopsy now. I have placed an order for a bronchoscopy with biopsies.  We have discussed the procedure in detail.  We have reviewed the risks and benefits of the procedure. These include bleeding, infection, puncture of the lung, and adverse reaction to anesthesia. You have agreed to proceed with biopsy to evaluate the right lobe lung nodule. Your procedure will be done by Dr. Lamar Chris. You will receive a letter today with date time and information pertaining  to the procedure. You will need someone to drive you to the procedure, stay with you during the procedure, and stay with you after the procedure. You will also need someone to stay with you for 24 hours after anesthesia to ensure you have cleared and are doing well. You will follow-up with me 1 week after the procedure to review the results and to ensure you are doing well. Call if you need us  prior to the procedure or if you have any questions at all. Please contact office for sooner follow up if symptoms do not improve or worsen or seek emergency care   I have also ordered a CT Chest for planning of the procedure.   You will get a call to get this scheduled.  History of Breast cancer History of breast cancer in 2005 and 2007 with no recurrence for nearly 20 years.  Family history of cancer in mother. Increases risk of lung nodule being malignant.  Type 2 diabetes mellitus, well controlled Well controlled with metformin. Recent HbA1c 6.4%.  History of blood clots post-hysterectomy Blood clots post-hysterectomy, previously associated with hormone therapy.  Currently on baby aspirin.  I spent 40 minutes dedicated to the care of this patient on the date of this encounter to include pre-visit review of records, face-to-face time with the patient discussing conditions above, post visit ordering of testing, clinical documentation with the electronic health record, making appropriate referrals as documented, and communicating necessary information to the patient's healthcare team.    Lisa JULIANNA Lites, NP 05/29/2024  8:38 AM

## 2024-05-29 NOTE — Telephone Encounter (Signed)
 Please schedule the following:  Provider performing procedure: Btrum Diagnosis:  Lung nodule Which side if for nodule / mass? right Procedure: Navigational Bronchoscopy with biopsies  Has patient been spoken to by Provider and given informed consent?  Yes, Lauraine Lites NP Anesthesia:  General  Do you need Fluro?  Yes Duration of procedure:  1.5 hours Date: 06/05/2024 Alternate Date: There are openings 7/29 so hope not to need one  Time: She would like the morning slot Location: [facility] Does patient have OSA?  NO DM? Yes, on Metformin Or Latex allergy?  NO Medication Restriction/ Anticoagulate/Antiplatelet:  81 mg ASA, which will need to be held x 48 hours Pre-op Labs Ordered:determined by Anesthesia Imaging request:  Needs Super D scheduled before bronch  (If, SuperDimension CT Chest, please have STAT courier sent to ENDO)

## 2024-05-29 NOTE — Telephone Encounter (Signed)
 Letter given by [facility] Case# [id number] sent to [facility] to get auth

## 2024-05-30 NOTE — Progress Notes (Signed)
 I agree with the plans as outlined above.   Lamar Chris, MD, PhD 05/30/2024, 1:48 PM  Pulmonary and Critical Care [phone] or if no answer before 7:00PM call [phone] For any issues after 7:00PM please call eLink [phone]

## 2024-06-03 ENCOUNTER — Ambulatory Visit (HOSPITAL_BASED_OUTPATIENT_CLINIC_OR_DEPARTMENT_OTHER)
Admission: RE | Admit: 2024-06-03 | Discharge: 2024-06-03 | Disposition: A | Discharge status: Home / Self Care | Source: Ambulatory Visit | Attending: Acute Care | Admitting: Acute Care

## 2024-06-03 DIAGNOSIS — R911 Solitary pulmonary nodule: Secondary | ICD-10-CM | POA: Insufficient documentation

## 2024-06-03 DIAGNOSIS — E041 Nontoxic single thyroid nodule: Secondary | ICD-10-CM | POA: Diagnosis not present

## 2024-06-03 DIAGNOSIS — I700 Atherosclerosis of aorta: Secondary | ICD-10-CM | POA: Diagnosis not present

## 2024-06-04 ENCOUNTER — Other Ambulatory Visit: Payer: Self-pay

## 2024-06-04 ENCOUNTER — Encounter (HOSPITAL_COMMUNITY): Payer: Self-pay | Admitting: Emergency Medicine

## 2024-06-04 NOTE — Anesthesia Preprocedure Evaluation (Signed)
 Anesthesia Evaluation  Patient identified by MRN, date of birth, ID band Patient awake    Reviewed: Allergy & Precautions, NPO status , Patient's Chart, lab work & pertinent test results  History of Anesthesia Complications Negative for: history of anesthetic complications  Airway Mallampati: II  TM Distance: >3 FB Neck ROM: Full    Dental  (+) Dental Advisory Given,    Pulmonary neg shortness of breath, neg sleep apnea, neg COPD, neg recent URI   breath sounds clear to auscultation       Cardiovascular hypertension, Pt. on medications (-) angina (-) Past MI and (-) CHF + Valvular Problems/Murmurs  Rhythm:Irregular + Systolic murmurs 1. Left ventricular ejection fraction, by estimation, is 55 to 60%. The  left ventricle has normal function. The left ventricle has no regional  wall motion abnormalities. There is mild left ventricular hypertrophy.  Left ventricular diastolic parameters  are consistent with Grade II diastolic dysfunction (pseudonormalization).   2. Right ventricular systolic function is normal. The right ventricular  size is normal. There is normal pulmonary artery systolic pressure. The  estimated right ventricular systolic pressure is 31.9 mmHg.   3. Left atrial size was mildly dilated.   4. The mitral valve is normal in structure. Mild mitral valve  regurgitation. No evidence of mitral stenosis.   5. The aortic valve is tricuspid. There is moderate calcification of the  aortic valve. Aortic valve regurgitation is trivial. Mild aortic valve  stenosis. Aortic valve mean gradient measures 11.0 mmHg.   6. A small pericardial effusion is present. The pericardial effusion is  circumferential.   7. The inferior vena cava is normal in size with greater than 50%  respiratory variability, suggesting right atrial pressure of 3 mmHg.     Neuro/Psych negative neurological ROS  negative psych ROS   GI/Hepatic negative GI  ROS, Neg liver ROS,,,  Endo/Other  diabetes, Type 2, Oral Hypoglycemic Agents    Renal/GU Lab Results      Component                Value               Date                      NA                       135                 06/30/2015                K                        3.6                 06/30/2015                CO2                      27                  06/30/2015                GLUCOSE                  136 (H)             06/30/2015  BUN                      19                  06/30/2015                CREATININE               0.88                06/30/2015                CALCIUM                  9.9                 06/30/2015                GFRNONAA                 >60                 06/30/2015                Musculoskeletal  (+) Arthritis ,    Abdominal   Peds  Hematology negative hematology ROS (+) Lab Results      Component                Value               Date                      WBC                      5.8                 06/05/2024                HGB                      14.0                06/05/2024                HCT                      41.6                06/05/2024                MCV                      93.1                06/05/2024                PLT                      196                 06/05/2024              Anesthesia Other Findings   Reproductive/Obstetrics                              Anesthesia Physical Anesthesia Plan  ASA: 2  Anesthesia Plan: General  Post-op Pain Management: Minimal or no pain anticipated   Induction: Intravenous  PONV Risk Score and Plan: 3 and Ondansetron , Propofol  infusion and TIVA  Airway Management Planned: Oral ETT and Video Laryngoscope Planned  Additional Equipment: None  Intra-op Plan:   Post-operative Plan: Extubation in OR  Informed Consent: I have reviewed the patients History and Physical, chart, labs and discussed the procedure including the  risks, benefits and alternatives for the proposed anesthesia with the patient or authorized representative who has indicated his/her understanding and acceptance.     Dental advisory given  Plan Discussed with: CRNA  Anesthesia Plan Comments: (PAT note written 06/04/2024 by Nickalos Petersen, PA-C.  )         Anesthesia Quick Evaluation

## 2024-06-04 NOTE — Progress Notes (Signed)
 PCP - Claudene Pellet, MD  Cardiologist - Jeffrie Oneil BROCKS, MD   PPM/ICD - denies Device Orders - n/a Rep Notified - n/a  Chest x-ray -  EKG - 01-27-24 Stress Test - 04-18-14 ECHO - 06-16-22 Cardiac Cath -   CPAP - denies  GLP-1 -denies  Fasting Blood Sugar - Per patient ranges between 100-120 Checks Blood Sugar daily and when needed  Blood Thinner Instructions: denies Aspirin Instructions: LAST DOSE 06-01-24 per patient  ERAS Protcol - NPO  COVID TEST- n/a  Anesthesia review: yes, Hx of HTN, MVR, DVT, DM  Patient verbally denies any shortness of breath, fever, cough and chest pain during phone call   -------------  SDW INSTRUCTIONS given:  Your procedure is scheduled on June 05, 2024.  Report to [facility] Main Entrance A at 5:30 A.M., and check in at the Admitting office.  Call this number if you have problems the morning of surgery:  [phone]   Remember:  Do not eat or drink after midnight the night before your surgery      Take these medicines the morning of surgery with A SIP OF WATER  loratadine (CLARITIN)   As of today, STOP taking any Aspirin (unless otherwise instructed by your surgeon) Aleve, Naproxen, Ibuprofen, Motrin, Advil, Goody's, BC's, all herbal medications, fish oil, and all vitamins.                      Do not wear jewelry, make up, or nail polish            Do not wear lotions, powders, perfumes/colognes, or deodorant.            Do not shave 48 hours prior to surgery.  Men may shave face and neck.            Do not bring valuables to the hospital.            [facility] is not responsible for any belongings or valuables.  Do NOT Smoke (Tobacco/Vaping) 24 hours prior to your procedure If you use a CPAP at night, you may bring all equipment for your overnight stay.   Contacts, glasses, dentures or bridgework may not be worn into surgery.      For patients admitted to the hospital, discharge time will be determined by your treatment  team.   Patients discharged the day of surgery will not be allowed to drive home, and someone needs to stay with them for 24 hours.    Special instructions:   [place]- Preparing For Surgery  Before surgery, you can play an important role. Because skin is not sterile, your skin needs to be as free of germs as possible. You can reduce the number of germs on your skin by washing with CHG (chlorahexidine gluconate) Soap before surgery.  CHG is an antiseptic cleaner which kills germs and bonds with the skin to continue killing germs even after washing.    Oral Hygiene is also important to reduce your risk of infection.  Remember - BRUSH YOUR TEETH THE MORNING OF SURGERY WITH YOUR REGULAR TOOTHPASTE  Please do not use if you have an allergy to CHG or antibacterial soaps. If your skin becomes reddened/irritated stop using the CHG.  Do not shave (including legs and underarms) for at least 48 hours prior to first CHG shower. It is OK to shave your face.  Please follow these instructions carefully.   Shower the NIGHT BEFORE SURGERY and the MORNING OF  SURGERY with DIAL Soap.   Pat yourself dry with a CLEAN TOWEL.  Wear CLEAN PAJAMAS to bed the night before surgery  Place CLEAN SHEETS on your bed the night of your first shower and DO NOT SLEEP WITH PETS.   Day of Surgery: Please shower morning of surgery  Wear Clean/Comfortable clothing the morning of surgery Do not apply any deodorants/lotions.   Remember to brush your teeth WITH YOUR REGULAR TOOTHPASTE.   Questions were answered. Patient verbalized understanding of instructions.

## 2024-06-04 NOTE — Progress Notes (Signed)
 Anesthesia Chart Review: Lisa Bradshaw  Case: [id number] Date/Time: 06/05/24 0730   Procedure: VIDEO BRONCHOSCOPY WITH ENDOBRONCHIAL NAVIGATION (Right)   Anesthesia type: General   Diagnosis: Lung nodule seen on imaging study [R91.1]   Pre-op diagnosis: lung nodule   [facility] RO[facility] 3 / [facility]   Surgeons: Shelah Lamar RAMAN, MD       DISCUSSION: Patient is an 78 year old female scheduled for the above procedure.  History includes never smoker, HTN, DM2, hypercholesterolemia, DVT (1992), pericardial effusion (2002; small on 06/16/22 TEE, managed conservatively), murmur (mild MR, mild AS 06/16/22 TTE), mild LVH, breast cancer (left mastectomy in 2005; right 207), hysterectomy, renal cysts, lung nodule.   Last cardiology visit with Dr. Jeffrie was on 01/27/2024 for chest pain and fluttering.  She had mild MR and mild AAS on 06/16/2022 TTE, consider repeat in 2026.SABRA  She had a low risk PET/CT cardiac perfusion study but had an incidental finding of a suspicious 1.7 cm spiculated nodule in the right middle lobe.  She was referred to pulmonology.  Last aspirin dose reported as 06/01/2024.  Anesthesia team to evaluate on the day of surgery.   VS: Ht 5' 7 (1.702 m)   Wt 66.8 kg   BMI 23.07 kg/m  BP Readings from Last 3 Encounters:  05/29/24 (!) 140/72  04/18/24 (!) 112/46  01/27/24 (!) 148/72   Pulse Readings from Last 3 Encounters:  05/29/24 79  01/27/24 70  06/06/23 72     PROVIDERS: Claudene Pellet, MD is PCP  Jeffrie Anes, MD is cardiologist    LABS: She is for labs as indicated on arrival.   IMAGES: CT Super D Chest 06/03/24: In process.   EKG: 01/27/24: Sinus rhythm with Premature atrial complexes When compared with ECG of 20-Feb-2014 09:26, No significant change was found Confirmed by Jeffrie Anes ([id number]) on 01/27/2024 8:44:31 AM   CV: NM PET CT Cardiac Perfusion Study 04/18/24:   LV perfusion is normal. There is no evidence of ischemia. There is no  evidence of infarction.   Rest left ventricular function is normal. Rest EF: 55%. Stress left ventricular function is normal. Stress EF: 68%. End diastolic cavity size is normal. End systolic cavity size is normal.   Myocardial blood flow was computed to be 1.11ml/g/min at rest and 2.46ml/g/min at stress. Global myocardial blood flow reserve was 2.19 and was normal.   Coronary calcium was present on the attenuation correction CT images. Mild coronary calcifications were present. Coronary calcifications were present in the left anterior descending artery and left circumflex artery distribution(s).   The study is normal. The study is low risk.    Echo 06/16/22: IMPRESSIONS   1. Left ventricular ejection fraction, by estimation, is 55 to 60%. The  left ventricle has normal function. The left ventricle has no regional  wall motion abnormalities. There is mild left ventricular hypertrophy.  Left ventricular diastolic parameters  are consistent with Grade II diastolic dysfunction (pseudonormalization).   2. Right ventricular systolic function is normal. The right ventricular  size is normal. There is normal pulmonary artery systolic pressure. The  estimated right ventricular systolic pressure is 31.9 mmHg.   3. Left atrial size was mildly dilated.   4. The mitral valve is normal in structure. Mild mitral valve  regurgitation. No evidence of mitral stenosis.   5. The aortic valve is tricuspid. There is moderate calcification of the  aortic valve. Aortic valve regurgitation is trivial. Mild aortic valve  stenosis. Aortic valve mean  gradient measures 11.0 mmHg.   6. A small pericardial effusion is present. The pericardial effusion is  circumferential.   7. The inferior vena cava is normal in size with greater than 50%  respiratory variability, suggesting right atrial pressure of 3 mmHg.   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 > 20 yr.   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 breast [facility])    Mass of finger of right hand 02/06/2014   thumb   Mild concentric left ventricular hypertrophy    Mitral valve regurgitation    Non-insulin dependent type 2 diabetes mellitus (HCC)    Osteoarthritis of hand    Pericardial effusion (noninflammatory) 11/08/2000   history of  - no longer sees cardiologist   Seasonal allergies     Past Surgical History:  Procedure Laterality Date   ABDOMINAL HYSTERECTOMY  1992   complete   CESAREAN SECTION  1969   EXCISION METACARPAL MASS Right 02/25/2014   Procedure: EXCISION MASS RIGHT THUMB  ;  Surgeon: Franky JONELLE Curia, MD;  Location: [facility];  Service: Orthopedics;  Laterality: Right;   FOOT NEUROMA SURGERY Left 2006   MASTECTOMY Left 04/03/2004   TOTAL MASTECTOMY Right 09/22/2006   with ax. node bx.   TRANSESOPHAGEAL ECHOCARDIOGRAM  08/23/2001    MEDICATIONS: No current facility-administered medications for this encounter.    aspirin 81 MG tablet   ezetimibe-simvastatin (VYTORIN) 10-40 MG per tablet   lisinopril-hydrochlorothiazide (ZESTORETIC) 20-12.5 MG tablet   loratadine (CLARITIN) 10 MG tablet   metFORMIN (GLUCOPHAGE) 500 MG tablet   Multiple Vitamin (MULTIVITAMIN) tablet    Isaiah Ruder, PA-C Surgical Short Stay/Anesthesiology [facility] Phone [phone] [facility] Phone [phone] 06/04/2024 3:47 PM

## 2024-06-05 ENCOUNTER — Ambulatory Visit (HOSPITAL_COMMUNITY)

## 2024-06-05 ENCOUNTER — Ambulatory Visit (HOSPITAL_COMMUNITY)
Admission: RE | Admit: 2024-06-05 | Discharge: 2024-06-05 | Disposition: A | Discharge status: Home / Self Care | Attending: Emergency Medicine | Admitting: Emergency Medicine

## 2024-06-05 ENCOUNTER — Encounter (HOSPITAL_COMMUNITY): Payer: Self-pay | Admitting: Emergency Medicine

## 2024-06-05 ENCOUNTER — Encounter (HOSPITAL_COMMUNITY)
Admission: RE | Disposition: A | Discharge status: Home / Self Care | Payer: Self-pay | Source: Home / Self Care | Attending: Emergency Medicine

## 2024-06-05 ENCOUNTER — Ambulatory Visit (HOSPITAL_BASED_OUTPATIENT_CLINIC_OR_DEPARTMENT_OTHER): Payer: Self-pay | Admitting: Vascular Surgery

## 2024-06-05 ENCOUNTER — Ambulatory Visit (HOSPITAL_COMMUNITY): Payer: Self-pay | Admitting: Vascular Surgery

## 2024-06-05 ENCOUNTER — Other Ambulatory Visit: Payer: Self-pay

## 2024-06-05 DIAGNOSIS — Z853 Personal history of malignant neoplasm of breast: Secondary | ICD-10-CM | POA: Insufficient documentation

## 2024-06-05 DIAGNOSIS — Z9013 Acquired absence of bilateral breasts and nipples: Secondary | ICD-10-CM | POA: Diagnosis not present

## 2024-06-05 DIAGNOSIS — Z7984 Long term (current) use of oral hypoglycemic drugs: Secondary | ICD-10-CM | POA: Diagnosis not present

## 2024-06-05 DIAGNOSIS — Z90710 Acquired absence of both cervix and uterus: Secondary | ICD-10-CM | POA: Diagnosis not present

## 2024-06-05 DIAGNOSIS — E119 Type 2 diabetes mellitus without complications: Secondary | ICD-10-CM | POA: Insufficient documentation

## 2024-06-05 DIAGNOSIS — I10 Essential (primary) hypertension: Secondary | ICD-10-CM | POA: Insufficient documentation

## 2024-06-05 DIAGNOSIS — Z79899 Other long term (current) drug therapy: Secondary | ICD-10-CM | POA: Insufficient documentation

## 2024-06-05 DIAGNOSIS — Z7982 Long term (current) use of aspirin: Secondary | ICD-10-CM | POA: Insufficient documentation

## 2024-06-05 DIAGNOSIS — R911 Solitary pulmonary nodule: Secondary | ICD-10-CM

## 2024-06-05 DIAGNOSIS — Z7722 Contact with and (suspected) exposure to environmental tobacco smoke (acute) (chronic): Secondary | ICD-10-CM | POA: Diagnosis not present

## 2024-06-05 DIAGNOSIS — A499 Bacterial infection, unspecified: Secondary | ICD-10-CM | POA: Diagnosis not present

## 2024-06-05 DIAGNOSIS — Z86718 Personal history of other venous thrombosis and embolism: Secondary | ICD-10-CM | POA: Insufficient documentation

## 2024-06-05 DIAGNOSIS — Z8041 Family history of malignant neoplasm of ovary: Secondary | ICD-10-CM | POA: Diagnosis not present

## 2024-06-05 HISTORY — PX: VIDEO BRONCHOSCOPY WITH ENDOBRONCHIAL NAVIGATION: SHX6175

## 2024-06-05 LAB — GLUCOSE, CAPILLARY: Glucose-Capillary: 103 mg/dL — ABNORMAL HIGH (ref 70–99)

## 2024-06-05 LAB — BASIC METABOLIC PANEL WITH GFR
Anion gap: 8 (ref 5–15)
BUN: 17 mg/dL (ref 8–23)
CO2: 26 mmol/L (ref 22–32)
Calcium: 9.8 mg/dL (ref 8.9–10.3)
Chloride: 106 mmol/L (ref 98–111)
Creatinine, Ser: 0.78 mg/dL (ref 0.44–1.00)
GFR, Estimated: >60 mL/min (ref >60)
Glucose, Bld: 108 mg/dL — ABNORMAL HIGH (ref 70–99)
Potassium: 3.3 mmol/L — ABNORMAL LOW (ref 3.5–5.1)
Sodium: 140 mmol/L (ref 135–145)

## 2024-06-05 LAB — CBC
HCT: 41.6 % (ref 36.0–46.0)
Hemoglobin: 14 g/dL (ref 12.0–15.0)
MCH: 31.3 pg (ref 26.0–34.0)
MCHC: 33.7 g/dL (ref 30.0–36.0)
MCV: 93.1 fL (ref 80.0–100.0)
Platelets: 196 K/uL (ref 150–400)
RBC: 4.47 MIL/uL (ref 3.87–5.11)
RDW: 12.4 % (ref 11.5–15.5)
WBC: 5.8 K/uL (ref 4.0–10.5)
nRBC: 0 % (ref 0.0–0.2)

## 2024-06-05 SURGERY — VIDEO BRONCHOSCOPY WITH ENDOBRONCHIAL NAVIGATION
Anesthesia: General | Laterality: Right

## 2024-06-05 MED ORDER — LIDOCAINE 2% (20 MG/ML) 5 ML SYRINGE
INTRAMUSCULAR | Status: DC | PRN
  Administered 2024-06-05: 60 mg via INTRAVENOUS

## 2024-06-05 MED ORDER — PROPOFOL 10 MG/ML IV BOLUS
INTRAVENOUS | Status: DC | PRN
  Administered 2024-06-05 (×2): 30 mg via INTRAVENOUS
  Administered 2024-06-05: 100 mg via INTRAVENOUS

## 2024-06-05 MED ORDER — FENTANYL CITRATE (PF) 250 MCG/5ML IJ SOLN
INTRAMUSCULAR | Status: DC | PRN
  Administered 2024-06-05: 50 ug via INTRAVENOUS

## 2024-06-05 MED ORDER — FENTANYL CITRATE (PF) 100 MCG/2ML IJ SOLN
INTRAMUSCULAR | Status: AC
  Filled 2024-06-05: qty 2

## 2024-06-05 MED ORDER — PHENYLEPHRINE 80 MCG/ML (10ML) SYRINGE FOR IV PUSH (FOR BLOOD PRESSURE SUPPORT)
PREFILLED_SYRINGE | INTRAVENOUS | Status: DC | PRN
  Administered 2024-06-05: 80 ug via INTRAVENOUS

## 2024-06-05 MED ORDER — PROPOFOL 500 MG/50ML IV EMUL
INTRAVENOUS | Status: DC | PRN
Start: 2024-06-05 — End: 2024-06-05
  Administered 2024-06-05: 125 ug/kg/min via INTRAVENOUS

## 2024-06-05 MED ORDER — LACTATED RINGERS IV SOLN: INTRAVENOUS | Status: DC

## 2024-06-05 MED ORDER — DEXAMETHASONE SODIUM PHOSPHATE 10 MG/ML IJ SOLN
INTRAMUSCULAR | Status: DC | PRN
  Administered 2024-06-05: 10 mg via INTRAVENOUS

## 2024-06-05 MED ORDER — ONDANSETRON HCL 4 MG/2ML IJ SOLN
INTRAMUSCULAR | Status: DC | PRN
  Administered 2024-06-05: 4 mg via INTRAVENOUS

## 2024-06-05 MED ORDER — CHLORHEXIDINE GLUCONATE 0.12 % MT SOLN
OROMUCOSAL | Status: DC
Start: 2024-06-05 — End: 2024-06-05
  Filled 2024-06-05: qty 15

## 2024-06-05 MED ORDER — SUGAMMADEX SODIUM 200 MG/2ML IV SOLN
INTRAVENOUS | Status: DC | PRN
  Administered 2024-06-05: 140 mg via INTRAVENOUS

## 2024-06-05 MED ORDER — ROCURONIUM BROMIDE 10 MG/ML (PF) SYRINGE
PREFILLED_SYRINGE | INTRAVENOUS | Status: DC | PRN
  Administered 2024-06-05: 60 mg via INTRAVENOUS

## 2024-06-05 SURGICAL SUPPLY — 37 items
ADAPTER BRONCHOSCOPE OLYMPUS (ADAPTER) ×1 IMPLANT
ADAPTER VALVE BIOPSY EBUS (MISCELLANEOUS) IMPLANT
BAG COUNTER SPONGE SURGICOUNT (BAG) ×1 IMPLANT
BRUSH CYTOL CELLEBRITY 1.5X140 (MISCELLANEOUS) ×1 IMPLANT
BRUSH SUPERTRAX BIOPSY (INSTRUMENTS) IMPLANT
BRUSH SUPERTRAX NDL-TIP CYTO (INSTRUMENTS) ×1 IMPLANT
CANISTER SUCTION 3000ML PPV (SUCTIONS) ×1 IMPLANT
CNTNR URN SCR LID CUP LEK RST (MISCELLANEOUS) ×1 IMPLANT
COVER BACK TABLE 60X90IN (DRAPES) ×1 IMPLANT
FILTER STRAW FLUID ASPIR (MISCELLANEOUS) IMPLANT
FORCEPS BIOP 1.5 SINGLE USE (MISCELLANEOUS) ×1 IMPLANT
FORCEPS BIOP SUPERTRX PREMAR (INSTRUMENTS) ×1 IMPLANT
GAUZE SPONGE 4X4 12PLY STRL (GAUZE/BANDAGES/DRESSINGS) ×1 IMPLANT
GLOVE BIO SURGEON STRL SZ7.5 (GLOVE) ×2 IMPLANT
GOWN STRL REUS W/ TWL LRG LVL3 (GOWN DISPOSABLE) ×2 IMPLANT
KIT CLEAN ENDO COMPLIANCE (KITS) ×1 IMPLANT
KIT LOCATABLE GUIDE (CANNULA) IMPLANT
KIT MARKER FIDUCIAL DELIVERY (KITS) IMPLANT
KIT TURNOVER KIT B (KITS) ×1 IMPLANT
MARKER SKIN DUAL TIP RULER LAB (MISCELLANEOUS) ×1 IMPLANT
NDL SUPERTRX PREMARK BIOPSY (NEEDLE) ×1 IMPLANT
NEEDLE SUPERTRX PREMARK BIOPSY (NEEDLE) ×1 IMPLANT
NS IRRIG 1000ML POUR BTL (IV SOLUTION) ×1 IMPLANT
OIL SILICONE PENTAX (PARTS (SERVICE/REPAIRS)) ×1 IMPLANT
PAD ARMBOARD POSITIONER FOAM (MISCELLANEOUS) ×2 IMPLANT
PATCHES PATIENT (LABEL) ×3 IMPLANT
SYR 20ML ECCENTRIC (SYRINGE) ×1 IMPLANT
SYR 20ML LL LF (SYRINGE) ×1 IMPLANT
SYR 50ML SLIP (SYRINGE) ×1 IMPLANT
TOWEL GREEN STERILE FF (TOWEL DISPOSABLE) ×1 IMPLANT
TRAP SPECIMEN MUCUS 40CC (MISCELLANEOUS) IMPLANT
TUBE CONNECTING 20X1/4 (TUBING) ×1 IMPLANT
UNDERPAD 30X36 HEAVY ABSORB (UNDERPADS AND DIAPERS) ×1 IMPLANT
VALVE BIOPSY SINGLE USE (MISCELLANEOUS) ×1 IMPLANT
VALVE SUCTION BRONCHIO DISP (MISCELLANEOUS) ×1 IMPLANT
WATER STERILE IRR 1000ML POUR (IV SOLUTION) ×1 IMPLANT
superlock fiducial marker IMPLANT

## 2024-06-05 NOTE — Anesthesia Postprocedure Evaluation (Signed)
 Anesthesia Post Note  Patient: Lisa Bradshaw  Procedure(s) Performed: VIDEO BRONCHOSCOPY WITH ENDOBRONCHIAL NAVIGATION (Right)     Patient location during evaluation: PACU Anesthesia Type: General Level of consciousness: awake and alert Pain management: pain level controlled Vital Signs Assessment: post-procedure vital signs reviewed and stable Respiratory status: spontaneous breathing, nonlabored ventilation and respiratory function stable Cardiovascular status: blood pressure returned to baseline and stable Postop Assessment: no apparent nausea or vomiting Anesthetic complications: no   No notable events documented.  Last Vitals:  Vitals:   06/05/24 0930 06/05/24 0940  BP: 137/71 125/68  Pulse: 82 73  Resp: (!) 25 (!) 29  Temp:    SpO2: (!) 89% 92%    Last Pain:  Vitals:   06/05/24 0930  TempSrc:   PainSc: 0-No pain                 Klee Kolek

## 2024-06-05 NOTE — Op Note (Signed)
 Procedure Note  Patient: Lisa Bradshaw  [employer] Cios mobile C-arm was utilized to identify and biopsy right middle lobe pulmonary nodule.  Cryoprobe-in-lesion was confirmed using real-time Cios imaging.  Lamar Chris, MD, PhD 06/05/2024, 9:10 AM [place] [facility] [phone] or if no answer before 7:00PM call [phone] For any issues after 7:00PM please call eLink [phone]

## 2024-06-05 NOTE — Transfer of Care (Signed)
 Immediate Anesthesia Transfer of Care Note  Patient: Lisa Bradshaw  Procedure(s) Performed: VIDEO BRONCHOSCOPY WITH ENDOBRONCHIAL NAVIGATION (Right)  Patient Location: Endoscopy Unit  Anesthesia Type:General  Level of Consciousness: awake, alert , and oriented  Airway & Oxygen Therapy: Patient Spontanous Breathing  Post-op Assessment: Report given to RN and Post -op Vital signs reviewed and stable  Post vital signs: Reviewed and stable  Last Vitals:  Vitals Value Taken Time  BP 138/70 06/05/24 09:15  Temp    Pulse 85 06/05/24 09:16  Resp 8 06/05/24 09:16  SpO2 92 % 06/05/24 09:16  Vitals shown include unfiled device data.  Last Pain:  Vitals:   06/05/24 0740  TempSrc:   PainSc: 0-No pain         Complications: No notable events documented.

## 2024-06-05 NOTE — Anesthesia Procedure Notes (Signed)
 Procedure Name: Intubation Date/Time: 06/05/2024 7:59 AM  Performed by: Blanca Carreon J, CRNAPre-anesthesia Checklist: Patient identified, Emergency Drugs available, Suction available and Patient being monitored Patient Re-evaluated:Patient Re-evaluated prior to induction Oxygen Delivery Method: Circle System Utilized Preoxygenation: Pre-oxygenation with 100% oxygen Induction Type: IV induction Ventilation: Mask ventilation without difficulty Laryngoscope Size: Glidescope and 3 Grade View: Grade I Tube type: Oral Tube size: 8.5 mm Number of attempts: 1 Airway Equipment and Method: Stylet and Oral airway Placement Confirmation: ETT inserted through vocal cords under direct vision, positive ETCO2 and breath sounds checked- equal and bilateral Secured at: 21 cm Tube secured with: Tape Dental Injury: Teeth and Oropharynx as per pre-operative assessment

## 2024-06-05 NOTE — Op Note (Signed)
 Video Bronchoscopy with Robotic Assisted Bronchoscopic Navigation   Date of Operation: 06/05/2024   Pre-op Diagnosis: Right middle lobe nodule  Post-op Diagnosis: Same  Surgeon: Lamar Chris  Assistants: None  Anesthesia: General endotracheal anesthesia  Operation: Flexible video fiberoptic bronchoscopy with robotic assistance and biopsies.  Estimated Blood Loss: Minimal  Complications: None  Indications and History: Lisa Bradshaw is a 78 y.o. female with history of right breast cancer.  She is a never smoker.  She had a right lower lobe nodule found spuriously on a cardiac scan.  Subsequent CT chest confirmed a nodule.  Recommendation made to achieve a tissue diagnosis via robotic assisted navigational bronchoscopy.  The risks, benefits, complications, treatment options and expected outcomes were discussed with the patient.  The possibilities of pneumothorax, pneumonia, reaction to medication, pulmonary aspiration, perforation of a viscus, bleeding, failure to diagnose a condition and creating a complication requiring transfusion or operation were discussed with the patient who freely signed the consent.    Description of Procedure: The patient was seen in the Preoperative Area, was examined and was deemed appropriate to proceed.  The patient was taken to [facility] Endoscopy room 3, identified as Lisa Bradshaw and the procedure verified as Flexible Video Fiberoptic Bronchoscopy.  A Time Out was held and the above information confirmed.   Prior to the date of the procedure a high-resolution CT scan of the chest was performed. Utilizing ION software program a virtual tracheobronchial tree was generated to allow the creation of distinct navigation pathways to the patient's parenchymal abnormalities. After being taken to the operating room general anesthesia was initiated and the patient  was orally intubated. The video fiberoptic bronchoscope was introduced via the endotracheal tube and a  general inspection was performed which showed normal right and left lung anatomy. Aspiration of the bilateral mainstems was completed to remove any remaining secretions. Robotic catheter inserted into patient's endotracheal tube.   Target #1 right middle lobe pulmonary nodule: The distinct navigation pathways prepared prior to this procedure were then utilized to navigate to patient's lesion identified on CT scan. The robotic catheter was secured into place and the vision probe was withdrawn.  Lesion location was approximated using fluoroscopy.  Local registration and targeting was performed using [employer] Cios mobile C-arm three-dimensional imaging. Under fluoroscopic guidance transbronchial needle biopsies and transbronchial cryoprobe biopsies were performed to be sent for cytology and pathology.   A bronchioalveolar lavage was performed in the right middle lobe and sent for cytology.  Under fluoroscopic guidance a single fiducial marker was placed adjacent to the nodule.   At the end of the procedure a general airway inspection was performed and there was no evidence of active bleeding. The bronchoscope was removed.  The patient tolerated the procedure well. There was no significant blood loss and there were no obvious complications. A post-procedural chest x-ray is pending.  Samples Target #1: 1. Transbronchial Wang needle biopsies from right middle lobe nodule 2. Transbronchial cryoprobe biopsies from right middle lobe nodule 3. Bronchoalveolar lavage from right middle lobe   Plans:  The patient will be discharged from the PACU to home when recovered from anesthesia and after chest x-ray is reviewed. We will review the cytology, pathology and microbiology results with the patient when they become available. Outpatient followup will be with CANDIE Lites, NP and Dr Chris.   Lamar Chris, MD, PhD 06/05/2024, 9:07 AM [place] [facility] [phone] or if no answer before  7:00PM call [phone] For any  issues after 7:00PM please call eLink [phone]

## 2024-06-05 NOTE — Interval H&P Note (Signed)
 History and Physical Interval Note:  06/05/2024 7:38 AM  Lisa Bradshaw  has presented today for surgery, with the diagnosis of lung nodule.  The various methods of treatment have been discussed with the patient and family. After consideration of risks, benefits and other options for treatment, the patient has consented to  Procedure(s): VIDEO BRONCHOSCOPY WITH ENDOBRONCHIAL NAVIGATION (Right) as a surgical intervention.  The patient's history has been reviewed, patient examined, no change in status, stable for surgery.  I have reviewed the patient's chart and labs.  Questions were answered to the patient's satisfaction.     Lisa Bradshaw

## 2024-06-05 NOTE — Discharge Instructions (Addendum)
 Flexible Bronchoscopy, Care After This sheet gives you information about how to care for yourself after your test. Your doctor may also give you more specific instructions. If you have problems or questions, contact your doctor. Follow these instructions at home: Eating and drinking When you are wide awake, your numbness is gone and your cough and gag reflexes have come back, you may: Start eating only soft foods. Slowly drink liquids. Six hours after the test, go back to your normal diet. Driving Do not drive for 24 hours if you were given a medicine to help you relax (sedative). Do not drive or use heavy machinery while taking prescription pain medicine. General instructions Take over-the-counter and prescription medicines only as told by your doctor. Return to your normal activities as told. Ask what activities are safe for you. Do not use any products that have nicotine or tobacco in them. This includes cigarettes and e-cigarettes. If you need help quitting, ask your doctor. Keep all follow-up visits as told by your doctor. This is important. It is very important if you had a tissue sample (biopsy) taken. Get help right away if: You have shortness of breath that gets worse. You get light-headed. You feel like you are going to pass out (faint). You have chest pain. You cough up: More than a little blood. More blood than before. Summary Do not use cigarettes. Do not use e-cigarettes. Seek care in the Emergency Department right away if you have chest pain or shortness of breath. Call or MyChart Message our office for any questions or problems at [phone].  Okay to restart aspirin on 06/06/2024.   This information is not intended to replace advice given to you by your health care provider. Make sure you discuss any questions you have with your health care provider.

## 2024-06-06 ENCOUNTER — Encounter (HOSPITAL_COMMUNITY): Payer: Self-pay | Admitting: Emergency Medicine

## 2024-06-07 DIAGNOSIS — I10 Essential (primary) hypertension: Secondary | ICD-10-CM | POA: Diagnosis not present

## 2024-06-07 DIAGNOSIS — I340 Nonrheumatic mitral (valve) insufficiency: Secondary | ICD-10-CM | POA: Diagnosis not present

## 2024-06-07 DIAGNOSIS — E119 Type 2 diabetes mellitus without complications: Secondary | ICD-10-CM | POA: Diagnosis not present

## 2024-06-07 DIAGNOSIS — E1169 Type 2 diabetes mellitus with other specified complication: Secondary | ICD-10-CM | POA: Diagnosis not present

## 2024-06-08 ENCOUNTER — Ambulatory Visit: Payer: Self-pay | Admitting: Emergency Medicine

## 2024-06-08 ENCOUNTER — Other Ambulatory Visit: Payer: Self-pay | Admitting: Acute Care

## 2024-06-08 DIAGNOSIS — R059 Cough, unspecified: Secondary | ICD-10-CM

## 2024-06-08 LAB — CYTOLOGY - NON PAP

## 2024-06-08 MED ORDER — AZITHROMYCIN 250 MG PO TABS: ORAL_TABLET | ORAL | 0 refills | Status: DC

## 2024-06-08 MED ORDER — PREDNISONE 10 MG PO TABS: ORAL_TABLET | ORAL | 0 refills | Status: DC

## 2024-06-08 NOTE — Telephone Encounter (Signed)
 FYI Only or Action Required?: Action required by provider: clinical question for provider and update on patient condition.  Patient is followed in Pulmonology for Lung Nodule, last seen on 05/29/2024 by Lisa Lauraine FALCON, NP.  Called Nurse Triage reporting Cough and Wheezing.  Symptoms began several days ago.  Interventions attempted: Increased fluids/rest.  Symptoms are: gradually worsening.  Triage Disposition: See HCP Within 4 Hours (Or PCP Triage)  Patient/caregiver understands and will follow disposition?: No, wishes to speak with PCP                 Copied from CRM [id number]. Topic: Clinical - Red Word Triage >> Jun 08, 2024  8:14 AM Corean SAUNDERS wrote: Red Word that prompted transfer to Nurse Triage: Wheezing, and worsening cough after a procedure done on Monday. Reason for Disposition  Wheezing is present  Answer Assessment - Initial Assessment Questions Patient states she was told her cough would be better a day or two after having a bronchoscopy done Monday---patient started with a dry cough the next day that isn't getting any better. She states that she notes some wheezing as well---She denies any difficulty breathing, swelling, feeling like her throat is closing etc She wanted to know if this was normal for after procedure---she added that the procedure took longer than expected   E2C2 Pulmonary Triage - Initial Assessment Questions Chief Complaint (e.g., cough, sob, wheezing, fever, chills, sweat or additional symptoms) *Go to specific symptom protocol after initial questions.     Persistent cough & pt states wheezing some as well  How long have symptoms been present?     Since Tuesday after procedure  Have you tested for COVID or Flu? Note: If not, ask patient if a home test can be taken. If so, instruct patient to call back for positive results. N/A  MEDICINES:   Have you used any OTC meds to help with symptoms? No If yes, ask What  medications? N/A  Have you used your inhalers/maintenance medication? No If yes, What medications? N/A  If inhaler, ask How many puffs and how often? Note: Review instructions on medication in the chart. N/A  OXYGEN: Do you wear supplemental oxygen? No If yes, How many liters are you supposed to use? N/A  Do you monitor your oxygen levels? No If yes, What is your reading (oxygen level) today? N/A  What is your usual oxygen saturation reading?  (Note: Pulmonary O2 sats should be 90% or greater) ------     1. ONSET: When did the cough begin?      Monday after procedure 2. SEVERITY: How bad is the cough today?      persistent 3. SPUTUM: Describe the color of your sputum (e.g., none, dry cough; clear, white, yellow, green)     N/A 4. HEMOPTYSIS: Are you coughing up any blood? If Yes, ask: How much? (e.g., flecks, streaks, tablespoons, etc.)     N/A 5. DIFFICULTY BREATHING: Are you having difficulty breathing? If Yes, ask: How bad is it? (e.g., mild, moderate, severe)      No 6. FEVER: Do you have a fever? If Yes, ask: What is your temperature, how was it measured, and when did it start?     No 7. CARDIAC HISTORY: Do you have any history of heart disease? (e.g., heart attack, congestive heart failure)      Heart murmur 8. LUNG HISTORY: Do you have any history of lung disease?  (e.g., pulmonary embolus, asthma, emphysema)     Nothing  but bronchoscopy  9. PE RISK FACTORS: Do you have a history of blood clots? (or: recent major surgery, recent prolonged travel, bedridden)     No 10. OTHER SYMPTOMS: Do you have any other symptoms? (e.g., runny nose, wheezing, chest pain)       Patient states wheezing in her upper chest  Protocols used: Cough - Acute Non-Productive-A-AH

## 2024-06-08 NOTE — Telephone Encounter (Signed)
 Sarah Groce, NP handled this already per other telephone encounter. NFN

## 2024-06-08 NOTE — Telephone Encounter (Signed)
 I called and spoke to pt. Pt complains of a cough since the Bronchoscopy and the wheezing started the next day. Pt complains of this being in her upper chest. Pt states this is a dry cough. Pt denies any pain or fever. Pt states the procedure last over an hour and thinks this may have been the cause of her cough and wheezing. Pt would like to know if this was what caused it.

## 2024-06-08 NOTE — Progress Notes (Signed)
 I have called the patient. She has been having cough and increased wheezing since the  bronchoscopy with biopsies done 06/04/2024.Cough is non productive and wheezing is exertional. No fever, or worsening shortness of breath. She does not have an oxygen saturation monitor.I have sent in a z pack, as well as a prednisone taper to see if this helps. I have also advised over the counter cough syrup. I have asked her to seek emergency care for any worsening shortness of breath , fever or lack of improvement with the antibiotics and prednisone. She verbalized understanding.

## 2024-06-11 ENCOUNTER — Ambulatory Visit: Admitting: Acute Care

## 2024-06-11 ENCOUNTER — Encounter: Payer: Self-pay | Admitting: Acute Care

## 2024-06-11 VITALS — BP 167/76 | HR 74 | Temp 97.8°F | Ht 67.0 in | Wt 148.2 lb

## 2024-06-11 DIAGNOSIS — R059 Cough, unspecified: Secondary | ICD-10-CM | POA: Diagnosis not present

## 2024-06-11 DIAGNOSIS — R9389 Abnormal findings on diagnostic imaging of other specified body structures: Secondary | ICD-10-CM

## 2024-06-11 DIAGNOSIS — R942 Abnormal results of pulmonary function studies: Secondary | ICD-10-CM | POA: Diagnosis not present

## 2024-06-11 DIAGNOSIS — A439 Nocardiosis, unspecified: Secondary | ICD-10-CM

## 2024-06-11 DIAGNOSIS — J40 Bronchitis, not specified as acute or chronic: Secondary | ICD-10-CM | POA: Diagnosis not present

## 2024-06-11 DIAGNOSIS — R911 Solitary pulmonary nodule: Secondary | ICD-10-CM

## 2024-06-11 DIAGNOSIS — Z9889 Other specified postprocedural states: Secondary | ICD-10-CM | POA: Diagnosis not present

## 2024-06-11 DIAGNOSIS — E049 Nontoxic goiter, unspecified: Secondary | ICD-10-CM

## 2024-06-11 DIAGNOSIS — J9589 Other postprocedural complications and disorders of respiratory system, not elsewhere classified: Secondary | ICD-10-CM | POA: Diagnosis not present

## 2024-06-11 MED ORDER — SULFAMETHOXAZOLE-TRIMETHOPRIM 800-160 MG PO TABS: 1.0000 | ORAL_TABLET | Freq: Two times a day (BID) | ORAL | 0 refills | Status: DC

## 2024-06-11 NOTE — Patient Instructions (Addendum)
 It is good to see you today. I am sorry you developed a cough after the procedure, and did not feel well.  Take z pack and prednisone  until gone.  The biopsy was negative for malignancy , which is great news.  There was a question of bacteria in one of the sputum collections. We will add Mucinex and Flutter valve to see if we can mobilize secretions . Take Mucinex 1200 mg daily followed by a full glass of water.  Use Flutter valve 4 blows , about 6 times a day . I have messaged Dr. Shelah  to see if he has any additional suggestions. I have ordered a follow up US  of the thyroid  to see if it needs biopsy. You will get a call to schedule this. You will follow up with me 1-2 weeks after the scan to review the results  MR brain to evaluate for nocardia, you will get a call to get this scheduled. You will follow up with me after. CT chest in 4 weeks to evaluate for treatment response to Bactrim  Follow up with me 1-2 weeks after to review results. Take Probiotic while taking antibiotic ( Culturelle or Align) Please contact office for sooner follow up if symptoms do not improve or worsen or seek emergency care

## 2024-06-11 NOTE — Progress Notes (Signed)
 History of Present Illness Lisa Bradshaw is a 78 y.o. female never smoker referred for lung nodule consult after incidental finding of a 1.7 cm spiculated pulmonary nodule noted on a cardiac PET scan.  She will be followed by Dr. Shelah.    06/11/2024 Discussed the use of AI scribe software for clinical note transcription with the patient, who gave verbal consent to proceed.  History of Present Illness Pt. Presents for follow up after navigational bronchoscopy with biopsies to better evaluate the incidental finding of a 1.7 cm spiculated right middle lobe pulmonary nodule concerning for bronchogenic malignancy.  Patient was seen in the office 05/29/2024 and after reviewing CT and pet imaging the decision was made for navigational bronchoscopy with biopsies.  Patient underwent procedure 06/05/2024.  Postprocedure she developed wheezing and a nonproductive cough.  She called the office Friday, 06/08/2024 and out of the upmost precaution she was treated with a Z-Pak and a prednisone  taper.  She stated significant improvement after treatment with antibiotic and prednisone .  She presents today with her daughter to discuss the results of her bronchoscopy and biopsies.  The biopsies were negative for malignancy however they were positive for filamentous bacteria, concerning for the possibility of nocardia.  I discussed this with Dr. Shelah and we decided to go ahead and treat the patient with Bactrim  DS x 21 days.  We will reimage the patient's chest at 4 weeks to see if there is a decrease in size of the right middle lobe mass.  If there is decrease in size we will consider additional treatment.  If there is not decrease in size we will refer to infectious disease for prolonged treatment.  I have asked the patient to take probiotic with the antibiotic as this will be a long-term antibiotic treatment in someone who is 78 years old.She is in agreement with this plan.  We will also check an MRI of the brain to rule  out Nocardia dissemination.  We will also start Mucinex and flutter valve to see if we can mobilize some of the secretions once they start breaking up.  Patient had a super D CT done to assist with navigation during the bronchoscopy.  This was revealing for a heterogeneous nodular enlargement of the thyroid  gland.  I will refer for ultrasound to better evaluate.  Lisa Bradshaw will follow-up with me after the ultrasound to review the results.  But the patient and her daughter are in agreement with the above plan.     Test Results: Cytology 06/05/2024 A. LUNG, RML, FINE NEEDLE ASPIRATION:  - Negative for malignancy.  - Filamentous bacteria, see comment.   Comment:  AFB stain highlights filamentous bacteria. 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 A GMS stain was attempted and  repeated but controls did not work and so are non-contributory. A HMB-45  stain was performed to evaluate a focal area of cells with a clear  appearance and is negative. Stain controls worked appropriately.  This case underwent intradepartmental consultation and Dr. [place] concurs with the interpretation.    B. LUNG, RML, LAVAGE:    FINAL MICROSCOPIC DIAGNOSIS:  - No malignant cells identified  - Benign bronchial cells       Latest Ref Rng & Units 06/05/2024    5:48 AM 06/30/2015   12:50 PM 02/25/2014    8:06 AM  CBC  WBC 4.0 - 10.5 K/uL 5.8  8.6  Hemoglobin 12.0 - 15.0 g/dL 85.9  84.4  85.9   Hematocrit 36.0 - 46.0 % 41.6  45.2    Platelets 150 - 400 K/uL 196  187         Latest Ref Rng & Units 06/05/2024    5:48 AM 06/30/2015   12:50 PM 02/20/2014    1:59 PM  BMP  Glucose 70 - 99 mg/dL 891  863  891   BUN 8 - 23 mg/dL 17  19  14    Creatinine 0.44 - 1.00 mg/dL 9.21  9.11  9.24   Sodium 135 - 145 mmol/L 140  135  142   Potassium 3.5 - 5.1 mmol/L 3.3  3.6  4.7   Chloride 98 - 111 mmol/L 106  98  105    CO2 22 - 32 mmol/L 26  27  27    Calcium 8.9 - 10.3 mg/dL 9.8  9.9  89.9     BNP No results found for: BNP  ProBNP No results found for: PROBNP  PFT No results found for: FEV1PRE, FEV1POST, FVCPRE, FVCPOST, TLC, DLCOUNC, PREFEV1FVCRT, PSTFEV1FVCRT  CT SUPER D CHEST WO CONTRAST Result Date: 06/09/2024 CLINICAL DATA:  Pulmonary nodule EXAM: CT CHEST WITHOUT CONTRAST TECHNIQUE: Multidetector CT imaging of the chest was performed using thin slice collimation for electromagnetic bronchoscopy planning purposes, without intravenous contrast. RADIATION DOSE REDUCTION: This exam was performed according to the departmental dose-optimization program which includes automated exposure control, adjustment of the mA and/or kV according to patient size and/or use of iterative reconstruction technique. COMPARISON:  04/18/2024 FINDINGS: Cardiovascular: Unenhanced imaging of the heart demonstrates no evidence of cardiomegaly. Calcifications of the mitral and aortic valves again noted. There is trace pericardial fluid unchanged. Normal caliber of the thoracic aorta. Atherosclerosis of the aorta and coronary vasculature. Assessment of the vascular lumen cannot be performed without intravenous contrast. Mediastinum/Nodes: No pathologic mediastinal or hilar adenopathy. Heterogeneous nodular enlarged thyroid  is noted, with exophytic nodule off the lower pole right lobe thyroid  extending in the retrosternal region, measuring 3.1 x 2.5 x 3.5 cm. Trachea and esophagus appear unremarkable. Lungs/Pleura: There is an irregular spiculated right middle lobe pulmonary nodule, measuring approximately 10 x 17 x 13 mm, reference image 71/4. This is highly concerning for bronchogenic malignancy. No additional pulmonary nodules. No acute airspace disease, effusion, or pneumothorax. The central airways are patent. Upper Abdomen: No acute abnormality. Musculoskeletal: No acute or destructive bony abnormalities.  Reconstructed images demonstrate no additional findings. IMPRESSION: 1. Spiculated 10 x 17 x 13 mm right middle lobe pulmonary nodule, highly concerning for bronchogenic malignancy. Correlation with PET scan or tissue sampling recommended. 2. Heterogeneous nodular enlargement of the thyroid  with exophytic 3.5 cm retrosternal nodule as above. Recommend non-emergent thyroid  ultrasound. Reference: J Am Coll Radiol. 2015 Feb;12(2): 143-50 3. Aortic Atherosclerosis (ICD10-I70.0). Coronary artery atherosclerosis. Electronically Signed   By: Ozell Daring M.D.   On: 06/09/2024 15:34   DG Chest Port 1 View Result Date: 06/05/2024 CLINICAL DATA:  Bronchoscopy and biopsy. EXAM: PORTABLE CHEST 1 VIEW COMPARISON:  Chest radiograph dated 06/30/2015. FINDINGS: No pneumothorax post biopsy. A 2.4 cm nodule in the right mid lung field with associated biopsy clip. No pleural effusion. The cardiac silhouette is within normal limits. No acute osseous pathology. IMPRESSION: 1. No pneumothorax post biopsy. 2. A 2.4 cm nodule in the right mid lung field with associated biopsy clip. Electronically Signed   By: Vanetta Chou M.D.   On: 06/05/2024 10:33   DG C-ARM BRONCHOSCOPY Result Date:  06/05/2024 C-ARM BRONCHOSCOPY: Fluoroscopy was utilized by the requesting physician.  No radiographic interpretation.     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 > 20 yr.   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 breast [facility])     Mass of finger of right hand 02/06/2014   thumb   Mild concentric left ventricular hypertrophy    Mitral valve regurgitation    Non-insulin dependent type 2 diabetes mellitus (HCC)    Osteoarthritis of hand    Pericardial effusion (noninflammatory) 11/08/2000   history of  - no longer sees cardiologist   Seasonal allergies      Social History   Tobacco Use   Smoking status: Never   Smokeless tobacco: Never  Substance Use Topics   Alcohol use: No   Drug use: No    Lisa Bradshaw reports that she has never smoked. She has never used smokeless tobacco. She reports that she does not drink alcohol and does not use drugs.  Tobacco Cessation: Counseling given: Not Answered Patient is a never smoker  Past surgical hx, Family hx, Social hx all reviewed.  Current Outpatient Medications on File Prior to Visit  Medication Sig   aspirin 81 MG tablet Take 81 mg by mouth daily.   azithromycin  (ZITHROMAX ) 250 MG tablet Take 2 tablets today, then one tablet daily for the next 4 days.   clindamycin (CLEOCIN) 150 MG capsule Take 150 mg by mouth 4 (four) times daily.   ezetimibe-simvastatin (VYTORIN) 10-40 MG per tablet Take 1 tablet by mouth daily.   lisinopril-hydrochlorothiazide (ZESTORETIC) 20-12.5 MG tablet Take 1 tablet by mouth daily.   loratadine (CLARITIN) 10 MG tablet Take 10 mg by mouth daily as needed for allergies.    metFORMIN (GLUCOPHAGE) 500 MG tablet Take 500 mg by mouth 2 (two) times daily with a meal.   Multiple Vitamin (MULTIVITAMIN) tablet Take 1 tablet by mouth daily.   predniSONE  (DELTASONE ) 10 MG tablet Prednisone  taper; 10 mg tablets: 4 tabs x 2 days, 3 tabs x 2 days, 2 tabs x 2 days 1 tab x 2 days then stop.   No current facility-administered medications on file prior to visit.     No Known Allergies  Review Of Systems:  Constitutional:   No  weight loss, night sweats,  Fevers, chills, fatigue, or  lassitude.  HEENT:   No headaches,  Difficulty swallowing,   Tooth/dental problems, or  +Sore throat,                No sneezing, itching, ear ache, nasal congestion, +post nasal drip,   CV:  No chest pain,  Orthopnea, PND, swelling in lower extremities, anasarca, dizziness, palpitations, syncope.   GI  No heartburn, indigestion, abdominal pain, nausea, vomiting, diarrhea, change in bowel habits, loss of appetite, bloody stools.   Resp: No shortness of  breath with exertion or at rest.  No excess mucus, no productive cough,  + non-productive cough,  No coughing up of blood.  No change in color of mucus.  + wheezing.  No chest wall deformity  Skin: no rash or lesions.  GU: no dysuria, change in color of urine, no urgency or frequency.  No flank pain, no hematuria   MS:  No joint pain or swelling.  No decreased range of motion.  No back pain.  Psych:  No change in mood or affect. No depression or anxiety.  No memory loss.   Vital Signs BP (!) 167/76   Pulse 74   Temp 97.8 F (36.6 C) (Temporal)   Ht 5' 7 (1.702 m)   Wt 148 lb 3.2 oz (67.2 kg)   SpO2 98%   BMI 23.21 kg/m    Physical Exam:  General- No distress,  A&Ox3, pleasant and appropriate ENT: No sinus tenderness, TM clear, pale nasal mucosa, no oral exudate,+ post nasal drip, no LAN, hoarse voice Cardiac: S1, S2, regular rate and rhythm, no murmur Chest: No wheeze/ rales/ dullness; no accessory muscle use, no nasal flaring, no sternal retractions, slightly diminished per bases Abd.: Soft Non-tender, , nondistended, bowel sounds positive, Body mass index is 23.21 kg/m.  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 - Order MRI brain to rule out Nocardia dissemination. - Refer to infectious disease if nodule size does not decrease post-treatment. - Advise daily probiotic such as Culturelle or Align while on antibiotics.  Acute bronchitis (improving) Significant improvement with prednisone  and azithromycin , new hoarseness noted. - Complete current prednisone  and Z-Pak course. - Add Mucinex 1200 mg daily with water to aid secretion mobilization. - Use flutter valve for secretion mobilization.  Abnormal thyroid  noted on super D CT chest - Ultrasound thyroid  ordered - Follow-up in 1 to 2 weeks after thyroid  completed to review the results  Hoarseness New onset likely related to acute bronchitis and cough. - Advise salt water gargles for relief.  I spent 60 minutes dedicated to the care of this patient on the date of this encounter to include pre-visit review of records, face-to-face time with the patient discussing conditions above, post visit ordering of testing, clinical documentation with the electronic health record, making appropriate referrals as documented, and communicating necessary information to the patient's healthcare team.      Lauraine JULIANNA Lites, NP 06/11/2024  10:01 AM

## 2024-06-13 ENCOUNTER — Ambulatory Visit
Admission: RE | Admit: 2024-06-13 | Discharge: 2024-06-13 | Disposition: A | Discharge status: Home / Self Care | Source: Ambulatory Visit | Attending: Acute Care | Admitting: Acute Care

## 2024-06-13 DIAGNOSIS — E041 Nontoxic single thyroid nodule: Secondary | ICD-10-CM | POA: Diagnosis not present

## 2024-06-13 DIAGNOSIS — E049 Nontoxic goiter, unspecified: Secondary | ICD-10-CM

## 2024-06-15 DIAGNOSIS — I340 Nonrheumatic mitral (valve) insufficiency: Secondary | ICD-10-CM | POA: Diagnosis not present

## 2024-06-15 DIAGNOSIS — E1169 Type 2 diabetes mellitus with other specified complication: Secondary | ICD-10-CM | POA: Diagnosis not present

## 2024-06-15 DIAGNOSIS — E119 Type 2 diabetes mellitus without complications: Secondary | ICD-10-CM | POA: Diagnosis not present

## 2024-06-15 DIAGNOSIS — I10 Essential (primary) hypertension: Secondary | ICD-10-CM | POA: Diagnosis not present

## 2024-06-20 ENCOUNTER — Telehealth: Payer: Self-pay | Admitting: Acute Care

## 2024-06-21 ENCOUNTER — Other Ambulatory Visit: Payer: Self-pay | Admitting: Acute Care

## 2024-06-21 ENCOUNTER — Telehealth: Payer: Self-pay | Admitting: Acute Care

## 2024-06-21 DIAGNOSIS — R9389 Abnormal findings on diagnostic imaging of other specified body structures: Secondary | ICD-10-CM

## 2024-06-21 NOTE — Telephone Encounter (Signed)
 Patient had the ultrasound of the thyroid  on 8/6,do you want her to get in to be seen.

## 2024-06-21 NOTE — Progress Notes (Signed)
 See Telephone note dated 06/21/2024

## 2024-06-21 NOTE — Telephone Encounter (Signed)
 Reminder set for biopsy results.

## 2024-06-21 NOTE — Telephone Encounter (Signed)
 I have called the patient with the results of her thyroid  US . Recommendation is for biopsy of nodule # 3 which is  3.3 cm TI-RADS category 3 nodule in the right lower gland and meets criteria to consider fine-needle aspiration biopsy. She is in agreement with proceeding with the biopsy. I will place the order. She has follow up with me 9/17 at 8:30 am to review the results of her CT Chest, her MRI Brain and her thyroid  biopsy. She has been monitoring her blood sugar closely while on the Bactrim  and Metformin as we had discussed. The lowest it has been is 89. She carries candy and oranges with her for when she has blood sugar lows.   Screening team, can you help me watch for her biopsy results? Thanks so much

## 2024-06-21 NOTE — Telephone Encounter (Signed)
 Responded via [employer]

## 2024-06-25 ENCOUNTER — Ambulatory Visit
Admission: RE | Admit: 2024-06-25 | Discharge: 2024-06-25 | Disposition: A | Discharge status: Home / Self Care | Source: Ambulatory Visit | Attending: Acute Care | Admitting: Acute Care

## 2024-06-25 ENCOUNTER — Telehealth: Payer: Self-pay | Admitting: Cardiology

## 2024-06-25 ENCOUNTER — Other Ambulatory Visit (HOSPITAL_COMMUNITY)
Admission: RE | Admit: 2024-06-25 | Discharge: 2024-06-25 | Disposition: A | Discharge status: Home / Self Care | Source: Ambulatory Visit | Attending: Student | Admitting: Student

## 2024-06-25 DIAGNOSIS — R9389 Abnormal findings on diagnostic imaging of other specified body structures: Secondary | ICD-10-CM | POA: Diagnosis not present

## 2024-06-25 DIAGNOSIS — E041 Nontoxic single thyroid nodule: Secondary | ICD-10-CM | POA: Diagnosis not present

## 2024-06-25 NOTE — Telephone Encounter (Signed)
 Pt c/o BP issue: STAT if pt c/o blurred vision, one-sided weakness or slurred speech.  STAT if BP is GREATER than 180/120 TODAY.  STAT if BP is LESS than 90/60 and SYMPTOMATIC TODAY  1. What is your BP concern? low  2. Have you taken any BP medication today?yes  3. What are your last 5 BP readings?38/469, 38/149, 45/125  4. Are you having any other symptoms (ex. Dizziness, headache, blurred vision, passed out)? Dizziness

## 2024-06-25 NOTE — Telephone Encounter (Signed)
 Patient calling with fluctuating blood pressure  reading from [employer] watch. Denies blurred vision, lightheadedness or  reports occasion dizziness. Patient reports taking Antibiotic 2 weeks and has one week to go for lung infection. Patient reports taking blood pressure medication as directed. Patient reports blood at 11:00 pressure 103/56, heart rate 91. Patient reports blood pressure systolic range 99-125, diastolic 48-mid's 70. Patient requesting visit. Appt made with provider 8/25@9 :15. ED precautions reviewed with patient and patient verbalized an understanding.

## 2024-06-27 DIAGNOSIS — A439 Nocardiosis, unspecified: Secondary | ICD-10-CM | POA: Diagnosis not present

## 2024-06-27 DIAGNOSIS — I340 Nonrheumatic mitral (valve) insufficiency: Secondary | ICD-10-CM | POA: Diagnosis not present

## 2024-06-27 DIAGNOSIS — E782 Mixed hyperlipidemia: Secondary | ICD-10-CM | POA: Diagnosis not present

## 2024-06-27 DIAGNOSIS — E1169 Type 2 diabetes mellitus with other specified complication: Secondary | ICD-10-CM | POA: Diagnosis not present

## 2024-06-27 DIAGNOSIS — I10 Essential (primary) hypertension: Secondary | ICD-10-CM | POA: Diagnosis not present

## 2024-06-27 DIAGNOSIS — E041 Nontoxic single thyroid nodule: Secondary | ICD-10-CM | POA: Diagnosis not present

## 2024-06-27 LAB — CYTOLOGY - NON PAP

## 2024-06-28 ENCOUNTER — Telehealth: Payer: Self-pay | Admitting: Acute Care

## 2024-06-28 ENCOUNTER — Telehealth: Payer: Self-pay | Admitting: *Deleted

## 2024-06-28 NOTE — Telephone Encounter (Signed)
 Copied from CRM #[id number]. Topic: General - Call Back - No Documentation >> Jun 28, 2024  3:58 PM Celestine F wrote: Reason for CRM: Pt is returning a missed call, but isn't sure why she was called as she wasn't expecting a call from pulm office. I checked encounters, apt desk, and any notes on appt notes with no info on the call.   Pt's phone number is [phone] ok to leave a vm.      (Signed last encounter in error)

## 2024-06-28 NOTE — Telephone Encounter (Signed)
 Copied from CRM #[id number]. Topic: General - Call Back - No Documentation >> Jun 28, 2024  3:58 PM Celestine F wrote: Reason for CRM: Pt is returning a missed call, but isn't sure why she was called as she wasn't expecting a call from pulm office. I checked encounters, apt desk, and any notes on appt notes with no info on the call.  Pt's phone number is [phone] ok to leave a vm.

## 2024-06-29 ENCOUNTER — Telehealth: Payer: Self-pay | Admitting: *Deleted

## 2024-06-29 ENCOUNTER — Other Ambulatory Visit: Payer: Self-pay | Admitting: Acute Care

## 2024-06-29 DIAGNOSIS — A439 Nocardiosis, unspecified: Secondary | ICD-10-CM

## 2024-06-29 MED ORDER — SULFAMETHOXAZOLE-TRIMETHOPRIM 800-160 MG PO TABS: 1.0000 | ORAL_TABLET | Freq: Two times a day (BID) | ORAL | 1 refills | Status: DC

## 2024-06-29 NOTE — Progress Notes (Signed)
 I have called the patient as she is complaining of feeling like she still has infection in the lungs. . She runs out of Bactrim  Sunday. She has her follow up scan 07/03/2024. She has MR head 07/06/2024. I have gone ahead and referred her to ID to be managed. The referral was placed today. She does have follow up with me 9/17 to review the results of the CT Chest and MR brain. . I did tell her the thyroid  biopsy was negative, which is great news. . I will ask Dr. Claudene to determine the follow up in regard to how often she would like the thyroid  US  to be done.  Pt. Verbalized understanding of the above and had no further questions at completion of the phone call.

## 2024-06-29 NOTE — Telephone Encounter (Signed)
**Note De-identified  [place] Obfuscation** Please advise 

## 2024-06-29 NOTE — Telephone Encounter (Signed)
 PT states someone called her to offer her a sooner appt to rev her Thyroid  results. Both appts offered were filled already. Please call PT if possibly Lisa Bradshaw wanted to see her sooner to rev these results.

## 2024-06-29 NOTE — Telephone Encounter (Signed)
 Handling via [email or web address]

## 2024-06-29 NOTE — Telephone Encounter (Signed)
 Per 8/21 encounter Wilford is handling via Mychart

## 2024-06-29 NOTE — Telephone Encounter (Signed)
 I have called the patient as she is complaining of feeling like she still has infection in the lungs. . She runs out of Bactrim  Sunday. She has her follow up scan 07/03/2024. She has MR head 07/06/2024. I have gone ahead and referred her to ID to be managed. The referral was placed today. She does have follow up with me 9/17 to review the results of the CT Chest and MR brain. . I did tell her the thyroid  biopsy was negative, which is great news. . I will ask Dr. Claudene to determine the follow up in regard to how often she would like the thyroid  US  to be done.  Pt. Verbalized understanding of the above and had no further questions at completion of the phone call.

## 2024-07-02 ENCOUNTER — Ambulatory Visit: Admitting: Cardiology

## 2024-07-02 NOTE — Progress Notes (Unsigned)
 Cardiology Office Note   Date:  07/03/2024  ID:  Lisa, Bradshaw [birth year], MRN [id number] PCP: Claudene Pellet, MD  [facility] Providers Cardiologist:  Oneil Parchment, MD   History of Present Illness Lisa Bradshaw is a 78 y.o. female with a past medical history of aortic stenosis who presented with chest pain and heaviness back in March.  Evaluated by Dr. Parchment and was having occasional chest pain sensation of heaviness particular when standing for extended.  Such as while switching of sprinkler burning.  The symptoms resolved upon sitting down but have intensified over time.  Nuclear stress test in 2023 was low risk and showed no ischemia.  Echocardiogram June 16, 2022 showed mild aortic valve stenosis with mean gradient of 11 mm of artery.  Has a systolic murmur and a small pericardial effusion is being treated conservatively.  Ejection fraction 60%.  Experienced symptoms of fluttering with premature atrial contractions EKG.  Heart rate can range 90 to 100 bpm while standing which she monitors she is in her watch.  Past medical history includes diabetes with a recent hemoglobin A1c of 6.7 and hyperlipidemia for which she takes Vytorin 10/40 mg daily.  Is also on lisinopril/HCTZ 12-12.5 mg daily for hypertension.  Also takes aspirin 81 mg daily for prevention.  LDL was recently checked at 68 and hemoglobin 13.6 no anemia.  She does feel coldness to her hands and feet more than she used to and attributes it to aging.  Socially, she remains active working in her garden and mowing her lawn although she does have some increased fatigue and requires more frequent breaks.  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  She acknowledges some cognitive slowing and refers to it as a 10-second thing when recalling names.  Today, she presents with hypertension and thyroid  issues who presents with concerns of possible atrial  fibrillation and recent episodes of nausea and sweating.  She experiences episodes of high heart rates, with device readings showing rates up to the 170s to 180s. A recent episode on her phone showed atrial fibrillation once in 2023. She felt like she was having a heart attack, experiencing nausea, sweating, and weakness, but no racing heart. She feels lightheaded and has low blood pressure readings, with a recent reading of 100/48.  She reports significant weight loss and episodes of low oxygen saturation, with a reading of 87% after exertion. She has a history of fluid around her heart, present for many years, and mentions a past heart murmur. She is currently on a blood pressure medication regimen, which was increased a year ago, but she has been experiencing low blood pressure readings recently.  Reports no shortness of breath nor dyspnea on exertion. Reports no chest pain, pressure, or tightness. No edema, orthopnea, PND. Reports no palpitations.   Discussed the use of AI scribe software for clinical note transcription with the patient, who gave verbal consent to proceed.   ROS: Pertinent ROS in HPI  Studies Reviewed  NM PET/CT 04/18/2024   LV perfusion is normal. There is no evidence of ischemia. There is no evidence of infarction.   Rest left ventricular function is normal. Rest EF: 55%. Stress left ventricular function is normal. Stress EF: 68%. End diastolic cavity size is normal. End systolic cavity size is normal.   Myocardial blood flow was computed to be 1.32ml/g/min at rest and 2.28ml/g/min at stress. Global myocardial blood flow  reserve was 2.19 and was normal.   Coronary calcium was present on the attenuation correction CT images. Mild coronary calcifications were present. Coronary calcifications were present in the left anterior descending artery and left circumflex artery distribution(s).   The study is normal. The study is low risk.   CLINICAL DATA:  This over-read does not  include interpretation of cardiac or coronary anatomy or pathology. The cardiac PET-CT interpretation by the cardiologist is attached.   COMPARISON:  None Available.   FINDINGS: 1.7 cm spiculated nodule is seen in the right middle lobe on image 24/4. This is suspicious for bronchogenic carcinoma. No pleural fluid seen.   The visualized portions of the mediastinum and chest wall are unremarkable. 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   IMPRESSION: 1.7 cm spiculated nodule in the right middle lobe, suspicious for bronchogenic carcinoma. Complete FDG PET-CT is recommended for further evaluation.   These results will be called to the ordering clinician or representative by the Radiologist Assistant, and communication documented in the PACS or [employer].     Electronically Signed   By: Norleen DELENA Kil M.D.   On: 04/18/2024 14:13      Physical Exam VS:  BP 122/60   Pulse 77   Ht 5' 7 (1.702 m)   Wt 146 lb 6.4 oz (66.4 kg)   SpO2 96%   BMI 22.93 kg/m        Wt Readings from Last 3 Encounters:  07/03/24 146 lb 6.4 oz (66.4 kg)  06/11/24 148 lb 3.2 oz (67.2 kg)  06/05/24 147 lb (66.7 kg)    GEN: Well nourished, well developed in no acute distress NECK: No JVD; No carotid bruits CARDIAC: IRIR, no murmurs, rubs, gallops RESPIRATORY:  Clear to auscultation without rales, wheezing or rhonchi  ABDOMEN: Soft, non-tender, non-distended EXTREMITIES:  No edema; No deformity   ASSESSMENT AND PLAN  Suspected paroxysmal atrial fibrillation with palpitations Intermittent palpitations and exertional chest pain with heart rate in the 170s-180s during sleep. No prior atrial fibrillation history. Symptoms include nausea, sweating, and vomiting, possibly due to high heart rate. Stroke risk if AFib confirmed. Differential includes PACs, PVCs, or other arrhythmias. App and watch accuracy for AFib detection is 95%. - Perform EKG to check for AFib. - Initiate  14-day Zio monitor to capture arrhythmias. - Consider starting blood thinner if AFib is confirmed. - Order echocardiogram to assess aortic valve and check for fluid around the heart.  Mild aortic valve stenosis Mild aortic stenosis noted in 2023. Potential contributor to lightheadedness and exertional symptoms. Requires updated evaluation. - Order echocardiogram to reassess aortic valve stenosis.  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 Symptoms may relate to thyroid  dysfunction, contributing to palpitations and arrhythmias. Possible link to AFib. - Order thyroid  panel including TSH, T4, and T3. - Refer to endocrinologist for further evaluation.  Hypertension with episodes of orthostatic hypotension Episodes of low blood pressure with systolic barely reaching 100 and diastolic as low as 48. Symptoms include dizziness and lightheadedness. Recent weight loss may affect blood pressure management. Current medication includes lisinopril, increased a year ago. Low blood pressure increases fall risk. - Monitor blood pressure at home. - Consider reducing lisinopril dose if blood pressure remains low. - Advise to report consistently low blood pressure readings.  Chronic stable pericardial effusion Long-standing pericardial effusion, present for decades without significant issues.      Dispo: She can follow-up in 8 weeks to review testing  Signed, Orren LOISE Fabry, PA-C

## 2024-07-03 ENCOUNTER — Ambulatory Visit (INDEPENDENT_AMBULATORY_CARE_PROVIDER_SITE_OTHER): Admitting: Physician Assistant

## 2024-07-03 ENCOUNTER — Ambulatory Visit

## 2024-07-03 ENCOUNTER — Ambulatory Visit (HOSPITAL_COMMUNITY)
Admission: RE | Admit: 2024-07-03 | Discharge: 2024-07-03 | Disposition: A | Discharge status: Home / Self Care | Source: Ambulatory Visit | Attending: Acute Care | Admitting: Acute Care

## 2024-07-03 VITALS — BP 122/60 | HR 77 | Ht 67.0 in | Wt 146.4 lb

## 2024-07-03 DIAGNOSIS — R011 Cardiac murmur, unspecified: Secondary | ICD-10-CM | POA: Insufficient documentation

## 2024-07-03 DIAGNOSIS — E7800 Pure hypercholesterolemia, unspecified: Secondary | ICD-10-CM | POA: Insufficient documentation

## 2024-07-03 DIAGNOSIS — I10 Essential (primary) hypertension: Secondary | ICD-10-CM

## 2024-07-03 DIAGNOSIS — E049 Nontoxic goiter, unspecified: Secondary | ICD-10-CM | POA: Insufficient documentation

## 2024-07-03 DIAGNOSIS — E042 Nontoxic multinodular goiter: Secondary | ICD-10-CM | POA: Diagnosis not present

## 2024-07-03 DIAGNOSIS — R079 Chest pain, unspecified: Secondary | ICD-10-CM | POA: Insufficient documentation

## 2024-07-03 DIAGNOSIS — E119 Type 2 diabetes mellitus without complications: Secondary | ICD-10-CM

## 2024-07-03 DIAGNOSIS — R002 Palpitations: Secondary | ICD-10-CM | POA: Diagnosis not present

## 2024-07-03 DIAGNOSIS — R918 Other nonspecific abnormal finding of lung field: Secondary | ICD-10-CM | POA: Diagnosis not present

## 2024-07-03 DIAGNOSIS — A439 Nocardiosis, unspecified: Secondary | ICD-10-CM | POA: Insufficient documentation

## 2024-07-03 NOTE — Progress Notes (Unsigned)
 Applied a 14 day Zio XT monitor to patient in the office  [place]ains to read

## 2024-07-03 NOTE — Patient Instructions (Signed)
 Medication Instructions:  Your physician recommends that you continue on your current medications as directed. Please refer to the Current Medication list given to you today.  *If you need a refill on your cardiac medications before your next appointment, please call your pharmacy*  Lab Work: TODAY: BMET, CBC, MAGNESIUM, THYROID  PANEL If you have labs (blood work) drawn today and your tests are completely normal, you will receive your results only by: MyChart Message (if you have MyChart) OR A paper copy in the mail If you have any lab test that is abnormal or we need to change your treatment, we will call you to review the results.  Testing/Procedures: Your physician has requested that you have an echocardiogram. Echocardiography is a painless test that uses sound waves to create images of your heart. It provides your doctor with information about the size and shape of your heart and how well your heart's chambers and valves are working. This procedure takes approximately one hour. There are no restrictions for this procedure. Please do NOT wear cologne, perfume, aftershave, or lotions (deodorant is allowed). Please arrive 15 minutes prior to your appointment time.  Please note: We ask at that you not bring children with you during ultrasound (echo/ vascular) testing. Due to room size and safety concerns, children are not allowed in the ultrasound rooms during exams. Our front office staff cannot provide observation of children in our lobby area while testing is being conducted. An adult accompanying a patient to their appointment will only be allowed in the ultrasound room at the discretion of the ultrasound technician under special circumstances. We apologize for any inconvenience.   Follow-Up: At [facility], you and your health needs are our priority.  As part of our continuing mission to provide you with exceptional heart care, our providers are all part of one team.  This team  includes your primary Cardiologist (physician) and Advanced Practice Providers or APPs (Physician Assistants and Nurse Practitioners) who all work together to provide you with the care you need, when you need it.  ZIO XT- Long Term Monitor Instructions  Your physician has requested you wear a ZIO patch monitor for 14 days.  This is a single patch monitor. [employer] supplies one patch monitor per enrollment. Additional stickers are not available. Please do not apply patch if you will be having a Nuclear Stress Test,  Echocardiogram, Cardiac CT, MRI, or Chest Xray during the period you would be wearing the  monitor. The patch cannot be worn during these tests. You cannot remove and re-apply the  ZIO XT patch monitor.  Your ZIO patch monitor will be mailed 3 day USPS to your address on file. It may take 3-5 days  to receive your monitor after you have been enrolled.  Once you have received your monitor, please review the enclosed instructions. Your monitor  has already been registered assigning a specific monitor serial # to you.  Billing and Patient Assistance Program Information  We have supplied [employer] with any of your insurance information on file for billing purposes. [employer] offers a sliding scale Patient Assistance Program for patients that do not have  insurance, or whose insurance does not completely cover the cost of the ZIO monitor.  You must apply for the Patient Assistance Program to qualify for this discounted rate.  To apply, please call [employer] at [phone], select option 4, select option 2, ask to apply for  Patient Assistance Program. Meredeth will ask your household income, and how many people  are in your household. They will quote your out-of-pocket cost based on that information.  [employer] will also be able to set up a 35-month, interest-free payment plan if needed.  Applying the monitor   Shave hair from upper left chest.  Hold abrader disc by orange tab. Rub abrader  in 40 strokes over the upper left chest as  indicated in your monitor instructions.  Clean area with 4 enclosed alcohol pads. Let dry.  Apply patch as indicated in monitor instructions. Patch will be placed under collarbone on left  side of chest with arrow pointing upward.  Rub patch adhesive wings for 2 minutes. Remove white label marked 1. Remove the white  label marked 2. Rub patch adhesive wings for 2 additional minutes.  While looking in a mirror, press and release button in center of patch. A small green light will  flash 3-4 times. This will be your only indicator that the monitor has been turned on.  Do not shower for the first 24 hours. You may shower after the first 24 hours.  Press the button if you feel a symptom. You will hear a small click. Record Date, Time and  Symptom in the Patient Logbook.  When you are ready to remove the patch, follow instructions on the last 2 pages of Patient  Logbook. Stick patch monitor onto the last page of Patient Logbook.  Place Patient Logbook in the blue and white box. Use locking tab on box and tape box closed  securely. The blue and white box has prepaid postage on it. Please place it in the mailbox as  soon as possible. Your physician should have your test results approximately 7 days after the  monitor has been mailed back to [facility].  Call [facility] Customer Care at [phone] if you have questions regarding  your ZIO XT patch monitor. Call them immediately if you see an orange light blinking on your  monitor.  If your monitor falls off in less than 4 days, contact our Monitor department at [phone].  If your monitor becomes loose or falls off after 4 days call [employer] at [phone] for  suggestions on securing your monitor   Your next appointment:   8 week(s)  Provider:   Oneil Parchment, MD  We recommend signing up for the patient portal called MyChart.  Sign up information is provided on this After Visit  Summary.  MyChart is used to connect with patients for Virtual Visits (Telemedicine).  Patients are able to view lab/test results, encounter notes, upcoming appointments, etc.  Non-urgent messages can be sent to your provider as well.   To learn more about what you can do with MyChart, go to [email or web address].

## 2024-07-04 ENCOUNTER — Telehealth: Payer: Self-pay | Admitting: Cardiology

## 2024-07-04 ENCOUNTER — Ambulatory Visit: Payer: Self-pay | Admitting: Physician Assistant

## 2024-07-04 LAB — MAGNESIUM: Magnesium: 2.1 mg/dL (ref 1.6–2.3)

## 2024-07-04 LAB — CBC
Hematocrit: 42.7 % (ref 34.0–46.6)
Hemoglobin: 14.1 g/dL (ref 11.1–15.9)
MCH: 31.8 pg (ref 26.6–33.0)
MCHC: 33 g/dL (ref 31.5–35.7)
MCV: 96 fL (ref 79–97)
Platelets: 218 x10E3/uL (ref 150–450)
RBC: 4.43 x10E6/uL (ref 3.77–5.28)
RDW: 12.6 % (ref 11.7–15.4)
WBC: 9.7 x10E3/uL (ref 3.4–10.8)

## 2024-07-04 LAB — BASIC METABOLIC PANEL WITH GFR
BUN/Creatinine Ratio: 18 (ref 12–28)
BUN: 23 mg/dL (ref 8–27)
CO2: 23 mmol/L (ref 20–29)
Calcium: 10.6 mg/dL — AB (ref 8.7–10.3)
Chloride: 100 mmol/L (ref 96–106)
Creatinine, Ser: 1.28 mg/dL — AB (ref 0.57–1.00)
Glucose: 88 mg/dL (ref 70–99)
Potassium: 4.4 mmol/L (ref 3.5–5.2)
Sodium: 138 mmol/L (ref 134–144)
eGFR: 42 mL/min/1.73 — AB (ref >59)

## 2024-07-04 LAB — THYROID PANEL WITH TSH
Free Thyroxine Index: 1.7 (ref 1.2–4.9)
T3 Uptake Ratio: 25 (ref 24–39)
T4, Total: 6.6 ug/dL (ref 4.5–12.0)
TSH: 1.43 u[IU]/mL (ref 0.450–4.500)

## 2024-07-04 NOTE — Telephone Encounter (Signed)
Returned call to patient and answered all her questions.  

## 2024-07-04 NOTE — Telephone Encounter (Signed)
 Patient stated she has an MRI scheduled on Friday (8/29) and is currently wearing a heart monitor and wants advice on next steps.

## 2024-07-04 NOTE — Telephone Encounter (Signed)
 Patient seen in office yesterday. 14 day zio applied while in office. She has brain MRI on 07/06/24. Will route to K. Steigelman for assistance with monitor, as it likely needs to be removed and will need new monitor post-MRI

## 2024-07-05 ENCOUNTER — Encounter: Payer: Self-pay | Admitting: Infectious Diseases

## 2024-07-05 ENCOUNTER — Other Ambulatory Visit: Payer: Self-pay

## 2024-07-05 ENCOUNTER — Ambulatory Visit: Admitting: Infectious Diseases

## 2024-07-05 VITALS — BP 135/61 | HR 74 | Temp 97.6°F | Ht 67.0 in | Wt 143.0 lb

## 2024-07-05 DIAGNOSIS — Z79899 Other long term (current) drug therapy: Secondary | ICD-10-CM

## 2024-07-05 DIAGNOSIS — N179 Acute kidney failure, unspecified: Secondary | ICD-10-CM

## 2024-07-05 DIAGNOSIS — R911 Solitary pulmonary nodule: Secondary | ICD-10-CM | POA: Diagnosis not present

## 2024-07-05 DIAGNOSIS — A439 Nocardiosis, unspecified: Secondary | ICD-10-CM

## 2024-07-05 DIAGNOSIS — Z114 Encounter for screening for human immunodeficiency virus [HIV]: Secondary | ICD-10-CM

## 2024-07-05 MED ORDER — SULFAMETHOXAZOLE-TRIMETHOPRIM 800-160 MG PO TABS
2.0000 | ORAL_TABLET | Freq: Two times a day (BID) | ORAL | 0 refills | Status: DC
Start: 2024-07-05 — End: 2024-08-02

## 2024-07-05 NOTE — Progress Notes (Addendum)
 Patient Active Problem List   Diagnosis Date Noted   Lung nodule seen on imaging study 05/29/2024   History of breast cancer in adulthood 04/11/2023   Chronic left shoulder pain 04/11/2023    Patient's Medications  New Prescriptions   No medications on file  Previous Medications   ASPIRIN 81 MG TABLET    Take 81 mg by mouth daily.   EZETIMIBE-SIMVASTATIN (VYTORIN) 10-40 MG PER TABLET    Take 1 tablet by mouth daily.   LISINOPRIL-HYDROCHLOROTHIAZIDE (ZESTORETIC) 20-12.5 MG TABLET    Take 1 tablet by mouth daily.   LORATADINE (CLARITIN) 10 MG TABLET    Take 10 mg by mouth daily as needed for allergies.    METFORMIN (GLUCOPHAGE) 500 MG TABLET    Take 500 mg by mouth 2 (two) times daily with a meal.   MULTIPLE VITAMIN (MULTIVITAMIN) TABLET    Take 1 tablet by mouth daily.   SULFAMETHOXAZOLE -TRIMETHOPRIM  (BACTRIM  DS) 800-160 MG TABLET    Take 1 tablet by mouth 2 (two) times daily.   SULFAMETHOXAZOLE -TRIMETHOPRIM  (BACTRIM  DS) 800-160 MG TABLET    Take 1 tablet by mouth 2 (two) times daily.  Modified Medications   No medications on file  Discontinued Medications   No medications on file    Subjective: Discussed the use of AI scribe software for clinical note transcription with the patient, who gave verbal consent to proceed.   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   She initially had a NM PET scan by Dr Jeffrie for evaluation of chest pain. Findings showed 1.7 cm spiculated nodule in the right middle lobe, suspicious for bronchogenic carcinoma.   This was followed by CT Chest which showed  Spiculated 10 x 17 x 13 mm right middle lobe pulmonary nodule, highly concerning for bronchogenic malignancy.  Heterogeneous nodular enlargement of the thyroid  with exophytic 3.5 cm retrosternal nodule as above.  Recommend non-emergent thyroid  ultrasound.  She was then referred to Pulmonary and underwent  Flexible video fiberoptic bronchoscopy and biopsies. Patient tells me some of the specimen got lost. Do not see any cultures were sent.   She denies any respiratory symptoms as such before the bronchoscopy like cough, SOB, fevers and chills. However, after the bronchoscopy, she started having cough and increased wheezing. She completed a course of Zpack and prednisone  taper with significant improvement.   She had a fu with Pulmonary on 8/4 where she was started on Bactrim  DS po bid for 21 days along with mucinex, flutter valve.   She also had an US  guided biopsy for the thyroid  nodule on 8/18. Path with benign follicular nodule.   Reports she is a pretty functional and highly active 78 year old woman and involved a lot of activities including volunteering. Denies any changes in appetite but reports weight down from 180 lbs to 147 lbs, with intentional dietary changes.   She experienced an episode of nausea and vomiting this Monday, accompanied by profuse sweating but no diarrhea/abdominal pain. No recent headaches, blurry vision, rashes, GU symptoms or neck pain, joint pain, although she has arthritis.   Denies smoking, alcohol, or recreational drug use. She lives alone and has two adult children. She denies being sexually active for many years.   Denies known h/o of any immunocompromising conditions or diseases or prolonged steroids.  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 > 20 yr.   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 breast [facility])    Mass of finger of right hand 02/06/2014   thumb   Mild concentric left ventricular hypertrophy    Mitral valve regurgitation    Non-insulin dependent type 2 diabetes mellitus (HCC)    Osteoarthritis of hand    Pericardial effusion (noninflammatory) 11/08/2000   history of  - no longer sees cardiologist   Seasonal allergies    Past Surgical History:  Procedure Laterality Date   ABDOMINAL HYSTERECTOMY  1992   complete   CESAREAN SECTION  1969   EXCISION METACARPAL MASS Right 02/25/2014   Procedure: EXCISION MASS RIGHT THUMB  ;  Surgeon: Franky JONELLE Curia, MD;  Location: [facility];  Service: Orthopedics;  Laterality: Right;   FOOT NEUROMA SURGERY Left 2006   MASTECTOMY Left 04/03/2004   TOTAL MASTECTOMY Right 09/22/2006   with ax. node bx.   TRANSESOPHAGEAL ECHOCARDIOGRAM  08/23/2001   VIDEO BRONCHOSCOPY WITH ENDOBRONCHIAL NAVIGATION Right 06/05/2024   Procedure: VIDEO BRONCHOSCOPY WITH ENDOBRONCHIAL NAVIGATION;  Surgeon: Shelah Lamar RAMAN, MD;  Location: [facility] END[facility]COPY;  Service: Pulmonary;  Laterality: Right;   Past Surgical History:  Procedure Laterality Date   ABDOMINAL HYSTERECTOMY  1992   complete   CESAREAN SECTION  1969   EXCISION METACARPAL MASS Right 02/25/2014   Procedure: EXCISION MASS RIGHT THUMB  ;  Surgeon: Franky JONELLE Curia, MD;  Location: [facility];  Service: Orthopedics;  Laterality: Right;   FOOT NEUROMA SURGERY Left 2006   MASTECTOMY Left 04/03/2004   TOTAL MASTECTOMY Right 09/22/2006   with ax. node bx.   TRANSESOPHAGEAL ECHOCARDIOGRAM  08/23/2001   VIDEO BRONCHOSCOPY WITH ENDOBRONCHIAL NAVIGATION Right 06/05/2024   Procedure: VIDEO BRONCHOSCOPY WITH ENDOBRONCHIAL NAVIGATION;  Surgeon: Shelah Lamar RAMAN, MD;  Location:  [facility] END[facility]COPY;  Service: Pulmonary;  Laterality: Right;     Social History   Tobacco Use   Smoking status: Never   Smokeless tobacco: Never  Substance Use Topics   Alcohol use: No   Drug use: No    Family History  Problem Relation Age of Onset   Cancer - Other Mother    Cancer - Other Father    Stroke Father    CAD Father     No Known Allergies  Health Maintenance  Topic Date Due   COVID-19 Vaccine (1) Never done   Diabetic kidney evaluation - Urine ACR  Never done   DTaP/Tdap/Td (1 - Tdap) Never done   DEXA SCAN  Never done   Medicare Annual Wellness (AWV)  12/09/2023   INFLUENZA VACCINE  06/08/2024   Diabetic kidney evaluation - eGFR measurement  07/03/2025   Pneumococcal Vaccine: 50+ Years  Completed   Zoster Vaccines- Shingrix  Completed   HPV VACCINES  Aged Out   Meningococcal B Vaccine  Aged Out   Colonoscopy  Discontinued    Objective: BP 135/61   Pulse 74   Temp 97.6 F (36.4 C) (Temporal)   Ht 5' 7 (1.702 m)   Wt 143 lb (64.9 kg)   SpO2 95%   BMI 22.40 kg/m    Physical Exam Constitutional:      Appearance: Normal appearance.  HENT:     Head: Normocephalic and atraumatic.      Mouth: Mucous membranes are moist.  Eyes:    Conjunctiva/sclera: Conjunctivae normal.     Pupils: Pupils are equal, round, and b/l symmetrical    Cardiovascular:     Rate and Rhythm: Normal rate and regular rhythm.     Heart sounds: S1S2  Pulmonary:     Effort: Pulmonary effort is normal.     Breath sounds: Normal breath sounds.   Abdominal:     General: Non distended     Palpations: soft.   Musculoskeletal:        General: Normal range of motion.   Skin:    General: Skin is warm and dry.     Comments:  Neurological:     General: grossly non focal     Mental Status: awake, alert and oriented to person, place, and time.   Psychiatric:        Mood and Affect: Mood normal.   Lab Results Lab Results  Component Value Date   WBC 9.7 07/03/2024   HGB  14.1 07/03/2024   HCT 42.7 07/03/2024   MCV 96 07/03/2024   PLT 218 07/03/2024    Lab Results  Component Value Date   CREATININE 1.28 (H) 07/03/2024   BUN 23 07/03/2024   NA 138 07/03/2024   K 4.4 07/03/2024   CL 100 07/03/2024   CO2 23 07/03/2024   No results found for: ALT, AST, GGT, ALKPHOS, BILITOT  No results found for: CHOL, HDL, LDLCALC, LDLDIRECT, TRIG, CHOLHDL No results found for: LABRPR, RPRTITER No results found for: HIV1RNAQUANT, HIV1RNAVL, CD4TABS   Microbiology No results found for this or any previous visit.  Pathology  7/29 FINAL MICROSCOPIC DIAGNOSIS:  - No malignant cells identified  - Benign bronchial cells   FINAL MICROSCOPIC DIAGNOSIS:  A. LUNG, RML, FINE NEEDLE ASPIRATION:  - Negative for malignancy.  - Filamentous bacteria, see comment.   Comment:  AFB stain highlights filamentous bacteria. 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 A GMS stain was attempted and  repeated but controls did not work and so are non-contributory. A HMB-45  stain was performed to evaluate a focal area of cells with a clear  appearance and is negative. Stain controls worked appropriately.  This case underwent intradepartmental consultation and Dr. Swaziland  concurs with the interpretation.  8/18 FINAL MICROSCOPIC DIAGNOSIS:  - Benign follicular nodule (Bethesda category II)   SPECIMEN ADEQUACY:  Satisfactory for evaluation   Imaging 04/18/24 NM PET CT Cardiac perfusion IMPRESSION: 1.7 cm spiculated nodule in the right middle lobe, suspicious for bronchogenic carcinoma. Complete FDG PET-CT is recommended for further evaluation.  06/03/24 CT super D chest  IMPRESSION: 1. Spiculated 10 x 17 x 13 mm right middle lobe pulmonary nodule, highly concerning for bronchogenic malignancy. Correlation with PET scan or tissue sampling  recommended. 2. Heterogeneous nodular enlargement of the thyroid  with exophytic 3.5  cm retrosternal nodule as above. Recommend non-emergent thyroid  ultrasound. Reference: J Am Coll Radiol. 2015 Feb;12(2): 143-50 3. Aortic Atherosclerosis (ICD10-I70.0). Coronary artery atherosclerosis.  06/05/24 CXR IMPRESSION: 1. No pneumothorax post biopsy. 2. A 2.4 cm nodule in the right mid lung field with associated biopsy clip.  06/13/24 US  thyroid   IMPRESSION: 1. Heterogeneous, enlarged and multinodular thyroid  gland most consistent with multinodular goiter. 2. Nodule # 3 is a 3.3 cm TI-RADS category 3 nodule in the right lower gland and meets criteria to consider fine-needle aspiration biopsy. 3. No other nodules meet criteria for biopsy or imaging surveillance.   8/18 CT chest  IMPRESSION: Successful ultrasound guided FNA biopsy of a 3.3 cm RIGHT inferior TR-3 thyroid  nodule.  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  - Fu 9/2 for repeat BMP. Discussed to take at least 1-1.5 L of water daily  - Fu MRI Brain - AFB sputum smear and cx, will need to induce sputum as she does not cough much phlegm and no BAL cultures - fu with Pulmonary as instructed. Will route note to Pulmonary.    # AKI  - in the setting of Bactrim  use - discussed hydration as above - BMP   # HIV screening  - agrees to HIV ag/ab  I spent 75 minutes involved in face-to-face and non-face-to-face activities for this patient on the day of the visit. Professional time spent includes the following activities: Preparing to see the patient (review of tests), Obtaining and reviewing  separately obtained history (notes from Pulmonary and Cardiology), Performing a medically appropriate examination and evaluation , Ordering medications/labs, referring and communicating with other health care professionals, Documenting clinical information in the EMR, Independently interpreting results (not separately reported), Communicating results to the patient, Counseling and educating the patient and Care coordination (not separately reported).   Of note, portions of this note may have been created with voice recognition software. While this note has been edited for accuracy, occasional wrong-word or 'sound-a-like' substitutions may have occurred due to the inherent limitations of voice recognition software.   Annalee Joseph, MD [facility] 07/05/2024, 7:47 AM

## 2024-07-06 ENCOUNTER — Ambulatory Visit
Admission: RE | Admit: 2024-07-06 | Discharge: 2024-07-06 | Disposition: A | Discharge status: Home / Self Care | Source: Ambulatory Visit | Attending: Acute Care | Admitting: Acute Care

## 2024-07-06 DIAGNOSIS — R9082 White matter disease, unspecified: Secondary | ICD-10-CM | POA: Diagnosis not present

## 2024-07-06 DIAGNOSIS — A439 Nocardiosis, unspecified: Secondary | ICD-10-CM

## 2024-07-06 MED ORDER — GADOPICLENOL 0.5 MMOL/ML IV SOLN
6.5000 mL | Freq: Once | INTRAVENOUS | Status: AC | PRN
  Administered 2024-07-06: 6.5 mL via INTRAVENOUS

## 2024-07-08 DIAGNOSIS — Z114 Encounter for screening for human immunodeficiency virus [HIV]: Secondary | ICD-10-CM | POA: Insufficient documentation

## 2024-07-08 DIAGNOSIS — I340 Nonrheumatic mitral (valve) insufficiency: Secondary | ICD-10-CM | POA: Diagnosis not present

## 2024-07-08 DIAGNOSIS — I10 Essential (primary) hypertension: Secondary | ICD-10-CM | POA: Diagnosis not present

## 2024-07-08 DIAGNOSIS — E119 Type 2 diabetes mellitus without complications: Secondary | ICD-10-CM | POA: Diagnosis not present

## 2024-07-08 DIAGNOSIS — Z79899 Other long term (current) drug therapy: Secondary | ICD-10-CM | POA: Insufficient documentation

## 2024-07-08 DIAGNOSIS — A439 Nocardiosis, unspecified: Secondary | ICD-10-CM | POA: Insufficient documentation

## 2024-07-08 DIAGNOSIS — E1169 Type 2 diabetes mellitus with other specified complication: Secondary | ICD-10-CM | POA: Diagnosis not present

## 2024-07-10 ENCOUNTER — Other Ambulatory Visit: Payer: Self-pay

## 2024-07-10 ENCOUNTER — Other Ambulatory Visit

## 2024-07-10 DIAGNOSIS — Z114 Encounter for screening for human immunodeficiency virus [HIV]: Secondary | ICD-10-CM

## 2024-07-10 NOTE — Progress Notes (Unsigned)
 Mugweru, Thom, MD  Baldwin Rosella L PROCEDURE / BIOPSY REVIEW Date: 07/09/24  Requested Biopsy site: RML nodule Reason for request: Negative Bronch Bx Imaging review: Best seen on CT Chest.  Decision: Approved Imaging modality to perform: CT Schedule with: Moderate Sedation Schedule for: Any VIR  Additional comments: @Schedulers . R lung mass Bx. CT guide.  Please contact me with questions, concerns, or if issue pertaining to this request arise.  Thom Hall, MD [facility]       Previous Messages    ----- Message ----- From: Baldwin Rosella CROME Sent: 07/06/2024  11:05 AM EDT To: Rosella CROME Baldwin; Ir Procedure Requests Subject: CT US  GUIDED BIOPSY                            Procedure :CT US  GUIDED BIOPSY  Reason :abnormal thyroid   Dx: Abnormal thyroid  ultrasound [R93.89 (ICD-10-CM)]   History :MR BRAIN W WO CONTRAST ,CT CHEST WO CONTRAST ,US  FNA BX THYROID  1ST LESION AFIRMA ,US  THYROID ,DG Chest Port 1 View,DG C-ARM BRONCHOSCOPY,CT SUPER D CHEST WO CONTRAST, NM PET CT CARDIAC PERFUSION MULTI W/ABSOLUTE BLOODFLOW   Provider: Ruthell Lauraine FALCON, NP

## 2024-07-10 NOTE — Progress Notes (Unsigned)
 [name], MD  Lisa Bradshaw Erskin Channing,  I've already done a review for this, this is just commentary. We need more information on biopsy requests to allow us  to process these efficiently. The request order system may need to be re-worked if it doesn't have a spot where the ordering provider can include exactly what they want reviewed etc.. or we may have to turn them away until they can provide the information. I had to dig into the Pts chart and try and imagine what this request was for, that's unsustainable.  Thanks, JM, MD       Previous Messages    ----- Message ----- From: Lisa Bradshaw Sent: 07/06/2024  11:05 AM EDT To: Channing Bradshaw Lisa; Ir Procedure Requests Subject: CT US  GUIDED BIOPSY                            Procedure :CT US  GUIDED BIOPSY  Reason :abnormal thyroid   Dx: Abnormal thyroid  ultrasound [R93.89 (ICD-10-CM)]   History :MR BRAIN W WO CONTRAST ,CT CHEST WO CONTRAST ,US  FNA BX THYROID  1ST LESION AFIRMA ,US  THYROID ,DG Chest Port 1 View,DG C-ARM BRONCHOSCOPY,CT SUPER D CHEST WO CONTRAST, NM PET CT CARDIAC PERFUSION MULTI W/ABSOLUTE BLOODFLOW   Provider: Ruthell Lauraine FALCON, NP  Provider contact ; [phone]

## 2024-07-11 ENCOUNTER — Ambulatory Visit: Payer: Self-pay | Admitting: Infectious Diseases

## 2024-07-11 ENCOUNTER — Telehealth (HOSPITAL_BASED_OUTPATIENT_CLINIC_OR_DEPARTMENT_OTHER): Payer: Self-pay

## 2024-07-11 LAB — BASIC METABOLIC PANEL WITH GFR
BUN/Creatinine Ratio: 16 (calc) (ref 6–22)
BUN: 19 mg/dL (ref 7–25)
CO2: 26 mmol/L (ref 20–32)
Calcium: 10.4 mg/dL (ref 8.6–10.4)
Chloride: 102 mmol/L (ref 98–110)
Creat: 1.17 mg/dL — ABNORMAL HIGH (ref 0.60–0.95)
Glucose, Bld: 104 mg/dL — ABNORMAL HIGH (ref 65–99)
Potassium: 4.4 mmol/L (ref 3.5–5.3)
Sodium: 138 mmol/L (ref 135–146)
eGFR: 47 mL/min/1.73m2 — ABNORMAL LOW (ref >=60)

## 2024-07-11 NOTE — Telephone Encounter (Signed)
 Copied from CRM [id number]. Topic: Clinical - Request for Lab/Test Order >> Jul 10, 2024 10:24 AM Russell PARAS wrote: Reason for CRM:   Channing, with Centralized Scheduling, is contacting clinic regarding order for CT scan/US  guided biopsy. She has sent 2 encounters over, which are present in pt's chart. Radiologist is needing more information regarding what is needed to be reviewed in biopsy request.   Channing provided [facility]- [phone], so that provider can contact and verify with radiologist what specifically is needing to be reviewed with biopsy.  Will not be able to schedule pt for imaging without more information.

## 2024-07-11 NOTE — Telephone Encounter (Signed)
**Note De-identified  [place] Obfuscation** Please advise 

## 2024-07-15 DIAGNOSIS — E1169 Type 2 diabetes mellitus with other specified complication: Secondary | ICD-10-CM | POA: Diagnosis not present

## 2024-07-15 DIAGNOSIS — E119 Type 2 diabetes mellitus without complications: Secondary | ICD-10-CM | POA: Diagnosis not present

## 2024-07-15 DIAGNOSIS — I340 Nonrheumatic mitral (valve) insufficiency: Secondary | ICD-10-CM | POA: Diagnosis not present

## 2024-07-15 DIAGNOSIS — I10 Essential (primary) hypertension: Secondary | ICD-10-CM | POA: Diagnosis not present

## 2024-07-16 NOTE — Telephone Encounter (Addendum)
 Lisa Bradshaw, I gave Channing a call in regards to the patient's CT US  guided Biopsy. Channing informed me that she received a message from Dr.Mugweru on the 2nd in regards to the patient. They need a call before they can get this patient scheduled.   Shortened version:For biopsy reviews, we need more detailed information in the order to process them efficiently. Without this, it requires extensive chart review, which is not sustainable.In reference to telephone encounter 07/10/24.

## 2024-07-17 ENCOUNTER — Telehealth: Payer: Self-pay | Admitting: Dietician

## 2024-07-17 NOTE — Telephone Encounter (Signed)
 Patient called and left a message on our answering machine requesting an appointment due to a thyroid  issue.  Chart reviewed and she does have a referral to the Endo office.  Will forward message to front office.  Leita Constable, RD, LDN, CDCES, DipACLM

## 2024-07-19 ENCOUNTER — Other Ambulatory Visit: Payer: Self-pay | Admitting: Infectious Diseases

## 2024-07-19 ENCOUNTER — Telehealth: Payer: Self-pay

## 2024-07-19 ENCOUNTER — Telehealth: Payer: Self-pay | Admitting: Acute Care

## 2024-07-19 DIAGNOSIS — Z79899 Other long term (current) drug therapy: Secondary | ICD-10-CM

## 2024-07-19 NOTE — Telephone Encounter (Signed)
 Thank you :)

## 2024-07-19 NOTE — Telephone Encounter (Signed)
 I called and spoke with Lisa Bradshaw regarding her thyroid  biopsy. As we had discussed her thyroid  biopsy 06/25/2024 was resulted a Benign follicular nodule (Bethesda category II) . We had discussed the biopsy result previously. I explained that I have asked her PCP to determine follow up imaging schedule for her.  While I was on the phone, we reviewed the results of her MR Brain, which was done to ensure there was no CNS involvement of Nocardia dissemination. . It was negative for any acute process.  Her repeat CT chest had been done. She and I are scheduled to review this at her appointment next week. This was scheduled as a 4 week follow up to see if there was a response to antibiotic therapy. There has been a slight decrease in the size of the spiculated lesion in the right middle lobe adjacent to 2 fiducials measures approximately 17 x 10 mm. This previously measured 17 x 12 mm. There is a new smoothly marginated cavitary lesion superior and medial to the spiculated lesion.Per radiology this could be a iquified hematoma related to prior biopsy, or a abscess, but radiology do not see surrounding inflammation . There is a new 3 mm RLL nodule.  I explained that I will review the scan results with Dr. Shelah, and when I see her next week we will have a plan. I have told her to continue to take her Bactrim  as she has been doing, per ID instructions( Dr. Dea 07/05/2024 note) . Pt. Did not have a follow up appointment with ID, which she was very upset about. I have sent a message to Dr. Dea and asked that she have her staff reach out to the patient to schedule follow up. She is unable to produce any sputum for culture, which is ideally what ID are requesting to confirm the Nocardia diagnosis.   I spent 21 minutes on the phone with the patient . She verbalized understanding of the above.

## 2024-07-19 NOTE — Telephone Encounter (Signed)
-----   Message from Annalee Joseph sent at 07/19/2024 10:53 AM EDT ----- Regarding: RE: Follow up to re-evaluate pulmonary nodule. Team,  Can we have her come in for lab visit today or tomorrow and then fu within 2 weeks? ----- Message ----- From: Ruthell Lauraine FALCON, NP Sent: 07/19/2024  10:19 AM EDT To: Lamar GORMAN Chris, MD; Annalee Orem, MD Subject: Follow up to re-evaluate pulmonary nodule.     Dr. Orem, I spoke with Ms. Gallen this morning. She has been compliant with her Bactrim . She is very concerned that she does not have a up appointment for ID. I think she would be reassured if someone called and scheduled her for her follow up. We will see her next week to review her CT  Chest results. Thanks so much.

## 2024-07-19 NOTE — Telephone Encounter (Signed)
 Spoke with patient, scheduled for labs 9/12 and provider follow up 9/25.  Raja Caputi, BSN, RN

## 2024-07-19 NOTE — Telephone Encounter (Signed)
 Hi Lisa Bradshaw, I was informed that you would need to contact [phone] regarding this. I'm not entirely sure how the process works, but I was told to pass this along to you.

## 2024-07-20 ENCOUNTER — Other Ambulatory Visit: Payer: Self-pay

## 2024-07-20 ENCOUNTER — Other Ambulatory Visit

## 2024-07-20 DIAGNOSIS — Z79899 Other long term (current) drug therapy: Secondary | ICD-10-CM

## 2024-07-20 LAB — BASIC METABOLIC PANEL WITH GFR
BUN/Creatinine Ratio: 14 (calc) (ref 6–22)
BUN: 14 mg/dL (ref 7–25)
CO2: 26 mmol/L (ref 20–32)
Calcium: 10.3 mg/dL (ref 8.6–10.4)
Chloride: 103 mmol/L (ref 98–110)
Creat: 1.02 mg/dL — ABNORMAL HIGH (ref 0.60–0.95)
Glucose, Bld: 102 mg/dL — ABNORMAL HIGH (ref 65–99)
Potassium: 4.3 mmol/L (ref 3.5–5.3)
Sodium: 137 mmol/L (ref 135–146)
eGFR: 56 mL/min/1.73m2 — ABNORMAL LOW (ref >=60)

## 2024-07-23 ENCOUNTER — Ambulatory Visit: Payer: Self-pay | Admitting: Infectious Diseases

## 2024-07-25 ENCOUNTER — Telehealth: Payer: Self-pay | Admitting: Acute Care

## 2024-07-25 ENCOUNTER — Encounter: Payer: Self-pay | Admitting: Acute Care

## 2024-07-25 ENCOUNTER — Other Ambulatory Visit: Payer: Self-pay | Admitting: Acute Care

## 2024-07-25 ENCOUNTER — Ambulatory Visit: Admitting: Acute Care

## 2024-07-25 VITALS — BP 169/92 | HR 79 | Temp 98.8°F | Ht 67.0 in | Wt 150.0 lb

## 2024-07-25 DIAGNOSIS — R911 Solitary pulmonary nodule: Secondary | ICD-10-CM

## 2024-07-25 DIAGNOSIS — N183 Chronic kidney disease, stage 3 unspecified: Secondary | ICD-10-CM

## 2024-07-25 DIAGNOSIS — I10 Essential (primary) hypertension: Secondary | ICD-10-CM | POA: Diagnosis not present

## 2024-07-25 DIAGNOSIS — R9389 Abnormal findings on diagnostic imaging of other specified body structures: Secondary | ICD-10-CM | POA: Diagnosis not present

## 2024-07-25 DIAGNOSIS — E1122 Type 2 diabetes mellitus with diabetic chronic kidney disease: Secondary | ICD-10-CM | POA: Diagnosis not present

## 2024-07-25 DIAGNOSIS — E1169 Type 2 diabetes mellitus with other specified complication: Secondary | ICD-10-CM | POA: Diagnosis not present

## 2024-07-25 DIAGNOSIS — K5900 Constipation, unspecified: Secondary | ICD-10-CM | POA: Diagnosis not present

## 2024-07-25 DIAGNOSIS — Z7984 Long term (current) use of oral hypoglycemic drugs: Secondary | ICD-10-CM

## 2024-07-25 DIAGNOSIS — Z23 Encounter for immunization: Secondary | ICD-10-CM | POA: Diagnosis not present

## 2024-07-25 NOTE — Progress Notes (Addendum)
 History of Present Illness Lisa Bradshaw is a 78 y.o. female never smoker referred for lung nodule consult after incidental finding of a 1.7 cm spiculated pulmonary nodule noted on a cardiac PET scan. She will be followed by Dr. Shelah.    07/25/2024 Discussed the use of AI scribe software for clinical note transcription with the patient, who gave verbal consent to proceed.  History of Present Illness Lisa Bradshaw is an 78 year old female who presents for follow-up on a pulmonary nodule.  Pt had bronchoscopy with biopsy was done 06/05/2024, and was found to be negative for malignancy. 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 She was started on Bactrim , and referral was made to ID. She is here today to look at the CT chest which was done 1 month after initiation of antibiotic therapy to evaluate for decrease in size of pulmonary nodule.  She has been on Bactrim  for 44 days, initially at a dose of two pills daily for 24 days, which was then increased to four pills daily by Infectious disease for the past 18 days. Her renal function is also being monitored by ID.  I actually had a 20-minute conversation with the patient on the phone on 07/19/2024 reviewing the CT results.  This showed that the nodule of concern had decreased from 17 x 12 mm to 17 x 10 mm after approximately 1 month on medication.  Patient continues to be very appropriately concerned that we see decrease in size of the pulmonary nodule with antimicrobial treatment.  I did reach out to both infectious disease and Dr. Shelah regarding when next follow-up imaging should be done to evaluate for further reduction in size of pulmonary nodule.  Dr. Shelah responded and felt that a 48-month follow-up was appropriate.  This has been ordered and patient will schedule.  Patient also has follow-up with infectious disease next week.  Patient is otherwise stable, no weight loss or hemoptysis.  She is just very appropriately  concerned that we see evidence of effective antimicrobial treatment.  We did discuss that if the next interval after imaging does not show significant improvement we will consider repeating the bronchoscopy with biopsy.  She is in agreement with this plan.  She is concerned about the possibility of cancer, despite negative initial biopsy, as previous discussions included the potential for the nodule to be cancerous or related to nocardia. She has been unable to produce additional sputum for further cultures.      Test Results: The spiculated lesion in the right middle lobe adjacent to 2 fiducials measures approximately 17 x 10 mm. This previously measured 17 x 12 mm. 2. New smoothly marginated and well circumscribed cavitary lesion located just superior and medial to the spiculated lesion. This is just under the minor fissure and was not present on the prior study. It does measure fluid attenuation and could be a liquified hematoma related to prior biopsy. A small abscess is also possible but I do not see any surrounding inflammatory changes. Recommend correlation with clinical findings and close surveillance. 3. New 3 mm nodule in the right lower lobe. 4. Stable 5 mm ground-glass nodules in the left lower lobe, right upper lobe and right middle lobe. 5. Stable numerous scattered calcified granulomas. 6. 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 These have been previously biopsied. 7. Stable three-vessel coronary artery calcifications. 8. Aortic atherosclerosis.     Latest Ref Rng & Units 07/03/2024    3:49 PM 06/05/2024  5:48 AM 06/30/2015   12:50 PM  CBC  WBC 3.4 - 10.8 x10E3/uL 9.7  5.8  8.6   Hemoglobin 11.1 - 15.9 g/dL 85.8  85.9  84.4   Hematocrit 34.0 - 46.6 % 42.7  41.6  45.2   Platelets 150 - 450 x10E3/uL 218  196  187        Latest Ref Rng & Units 07/20/2024    9:59 AM 07/10/2024   10:38 AM 07/03/2024    3:49 PM  BMP  Glucose 65 - 99 mg/dL 897  895   88   BUN 7 - 25 mg/dL 14  19  23    Creatinine 0.60 - 0.95 mg/dL 8.97  8.82  8.71   BUN/Creat Ratio 6 - 22 (calc) 14  16  18    Sodium 135 - 146 mmol/L 137  138  138   Potassium 3.5 - 5.3 mmol/L 4.3  4.4  4.4   Chloride 98 - 110 mmol/L 103  102  100   CO2 20 - 32 mmol/L 26  26  23    Calcium 8.6 - 10.4 mg/dL 89.6  89.5  89.3     BNP No results found for: BNP  ProBNP No results found for: PROBNP  PFT No results found for: FEV1PRE, FEV1POST, FVCPRE, FVCPOST, TLC, DLCOUNC, PREFEV1FVCRT, PSTFEV1FVCRT  MR BRAIN W WO CONTRAST Result Date: 07/12/2024 EXAM: MRI BRAIN WITH AND WITHOUT CONTRAST 07/06/2024 03:40:30 PM TECHNIQUE: Multiplanar multisequence MRI of the head/brain was performed with and without the administration of intravenous contrast. COMPARISON: None available. CLINICAL HISTORY: Meningitis/CNS infection suspected. Brain wwo; 6.5 ml vueway ; 1.0 ml wasted; Bacteria nocardia; ? Cns infection FINDINGS: BRAIN AND VENTRICLES: No acute infarct. No acute intracranial hemorrhage. No mass effect or midline shift. No hydrocephalus. The sella is unremarkable. Normal flow voids. No mass or abnormal enhancement. Mild chronic white matter hyperintensity. ORBITS: No acute abnormality. SINUSES: No acute abnormality. BONES AND SOFT TISSUES: Normal bone marrow signal and enhancement. No acute soft tissue abnormality. IMPRESSION: 1. No acute intracranial abnormality. 2. Mild chronic white matter hyperintensity, most commonly due to chronic small vessel disease . Electronically signed by: Franky Stanford MD 07/12/2024 12:02 AM EDT RP Workstation: [id number]   CT CHEST WO CONTRAST Result Date: 07/11/2024 CLINICAL DATA:  Follow-up lung nodule. EXAM: CT CHEST WITHOUT CONTRAST TECHNIQUE: Multidetector CT imaging of the chest was performed following the standard protocol without IV contrast. RADIATION DOSE REDUCTION: This exam was performed according to the departmental dose-optimization program which  includes automated exposure control, adjustment of the mA and/or kV according to patient size and/or use of iterative reconstruction technique. COMPARISON:  CT scan 06/03/2024 FINDINGS: Cardiovascular: The heart is normal in size. No pericardial effusion. The aorta is normal in caliber. Stable atherosclerotic calcifications. Stable three-vessel coronary artery calcifications. Mediastinum/Nodes: Stable small scattered mediastinal hilar lymph nodes but no mass or overt adenopathy. The esophagus is unremarkable. Stable appearing substernal thyroid  goiter with multiple thyroid  nodules. This has been evaluated on previous imaging. (ref: J Am Coll Radiol. 2015 Feb;12(2): 143-50).Lesions have been previously biopsied. Lungs/Pleura: The spiculated lesion in the right middle lobe adjacent to 2 fiducials measures approximately 17 x 10 mm on image 73/8. This previously measured 17 x 12 mm. However, there is a new smoothly marginated and well circumscribed cavitary lesion located just superior and medial to the spiculated lesion. This is just under the minor fissure and was not present on the prior study. Does measure fluid attenuation and could be a liquified hematoma related  to prior biopsy. A small abscess is also possible but I do not see any surrounding inflammatory changes. Recommend correlation with clinical findings and close surveillance. New 3 mm nodule in the right lower lobe noted on image number 84/8. Stable 5 mm ground-glass nodule in the left lower lobe on image number 96/8. Stable 5 mm subpleural sub solid nodule in the right upper lobe on image number 53/8. Stable 5 mm ground-glass nodule in the right middle lobe on image number 114/8. Stable numerous scattered calcified granulomas. Upper Abdomen: No significant upper abdominal findings. No hepatic or adrenal gland lesions. Stable vascular calcifications and stable simple renal cysts. Musculoskeletal: No significant bony findings. Stable osteoporosis and  scattered vertebral body hemangiomas. IMPRESSION: 1. The spiculated lesion in the right middle lobe adjacent to 2 fiducials measures approximately 17 x 10 mm. This previously measured 17 x 12 mm. 2. New smoothly marginated and well circumscribed cavitary lesion located just superior and medial to the spiculated lesion. This is just under the minor fissure and was not present on the prior study. It does measure fluid attenuation and could be a liquified hematoma related to prior biopsy. A small abscess is also possible but I do not see any surrounding inflammatory changes. Recommend correlation with clinical findings and close surveillance. 3. New 3 mm nodule in the right lower lobe. 4. Stable 5 mm ground-glass nodules in the left lower lobe, right upper lobe and right middle lobe. 5. Stable numerous scattered calcified granulomas. 6. 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 These have been previously biopsied. 7. Stable three-vessel coronary artery calcifications. 8. Aortic atherosclerosis. Aortic Atherosclerosis (ICD10-I70.0). Electronically Signed   By: MYRTIS Stammer M.D.   On: 07/11/2024 18:16   US  FNA BX THYROID  1ST LESION AFIRMA Result Date: 06/25/2024 INDICATION: Needs FNB of thyroid , Rt Inf, Nodule #3, 3.3 x 2.5 x 3.1cm Indeterminate right inferior thyroid  nodule EXAM: ULTRASOUND GUIDED FINE NEEDLE ASPIRATION OF INDETERMINATE THYROID  NODULE COMPARISON:  Ultrasound thyroid  06/13/2024 MEDICATIONS: 1% lidocaine  3 mL COMPLICATIONS: None immediate. TECHNIQUE: Informed written consent was obtained from the patient after a discussion of the risks, benefits and alternatives to treatment. Questions regarding the procedure were encouraged and answered. A timeout was performed prior to the initiation of the procedure. Pre-procedural ultrasound scanning demonstrated unchanged size and appearance of the indeterminate nodule within the right inferior thyroid . The procedure was planned. The neck  was prepped in the usual sterile fashion, and a sterile drape was applied covering the operative field. A timeout was performed prior to the initiation of the procedure. Local anesthesia was provided with 1% lidocaine . Under direct ultrasound guidance, 5 FNA biopsies were performed of the right inferior thyroid  nodule with a 25 gauge needle. Multiple ultrasound images were saved for procedural documentation purposes. The samples were prepared and submitted to pathology. Two of these samples were prepared for Afirma testing. Limited post procedural scanning was negative for hematoma or additional complication. Dressings were placed. The patient tolerated the above procedures procedure well without immediate postprocedural complication. FINDINGS: Nodule reference number based on prior diagnostic ultrasound: 3 Maximum size: 3.3 cm Location: Right; inferior ACR TI-RADS risk category: TR 3 Reason for biopsy: meets ACR TI-RADS criteria Ultrasound imaging confirms appropriate placement of the needles within the thyroid  nodule. IMPRESSION: Successful ultrasound guided FNA biopsy of a 3.3 cm RIGHT inferior TR-3 thyroid  nodule. Procedure performed by Warren Dais, NP under supervision of Thom Hall, MD Electronically Signed   By: Thom Hall M.D.   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 > 20 yr.   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 breast [facility])    Mass of finger of right  hand 02/06/2014   thumb   Mild concentric left ventricular hypertrophy    Mitral valve regurgitation    Non-insulin dependent type 2 diabetes mellitus (HCC)    Osteoarthritis of hand    Pericardial effusion (noninflammatory) 11/08/2000   history of  - no longer sees cardiologist   Seasonal allergies      Social History   Tobacco Use   Smoking status: Never    Passive exposure: Past   Smokeless tobacco: Never  Substance Use Topics   Alcohol use: No   Drug use: No    Ms.Heyward reports that she has never smoked. She has been exposed to tobacco smoke. She has never used smokeless tobacco. She reports that she does not drink alcohol and does not use drugs.  Tobacco Cessation: Counseling given: Not Answered Patient is a never smoker  Past surgical hx, Family hx, Social hx all reviewed.  Current Outpatient Medications on File Prior to Visit  Medication Sig   aspirin 81 MG tablet Take 81 mg by mouth daily.   ezetimibe-simvastatin (VYTORIN) 10-40 MG per tablet Take 1 tablet by mouth daily.   loratadine (CLARITIN) 10 MG tablet Take 10 mg by mouth daily as needed for allergies.    Multiple Vitamin (MULTIVITAMIN) tablet Take 1 tablet by mouth daily.   ONETOUCH VERIO test strip 1 each 2 (two) times daily.   sulfamethoxazole -trimethoprim  (BACTRIM  DS) 800-160 MG tablet Take 2 tablets by mouth 2 (two) times daily.   lisinopril-hydrochlorothiazide (ZESTORETIC) 20-12.5 MG tablet Take 1 tablet by mouth daily. (Patient not taking: Reported on 07/25/2024)   metFORMIN (GLUCOPHAGE) 500 MG tablet Take 500 mg by mouth 2 (two) times daily with a meal. (Patient not taking: Reported on 07/25/2024)   No current facility-administered medications on file prior to visit.     No Known Allergies  Review Of Systems:  Constitutional:   No  weight loss, night sweats,  Fevers, chills, fatigue, or  lassitude.  HEENT:   No headaches,  Difficulty swallowing,  Tooth/dental problems, or  Sore throat,                 No sneezing, itching, ear ache, nasal congestion, post nasal drip,   CV:  No chest pain,  Orthopnea, PND, swelling in lower extremities, anasarca, dizziness, palpitations, syncope.   GI  No heartburn, indigestion, abdominal pain, nausea, vomiting, diarrhea, change in bowel habits, loss of appetite, bloody stools.   Resp: No shortness of breath with exertion or at rest.  No excess mucus, no productive cough,  No non-productive cough,  No coughing up of blood.  No change in color  of mucus.  No wheezing.  No chest wall deformity  Skin: no rash or lesions.  GU: no dysuria, change in color of urine, no urgency or frequency.  No flank pain, no hematuria   MS:  No joint pain or swelling.  No decreased range of motion.  No back pain.  Psych:  No change in mood or affect. No depression or anxiety.  No memory loss.   Vital Signs BP (!) 169/92   Pulse 79   Temp 98.8 F (37.1 C) (Oral)   Ht 5' 7 (1.702 m)   Wt 150 lb (68 kg)   SpO2 98%   BMI 23.49 kg/m    Physical Exam:  General- No distress,  A&Ox3, pleasant and appropriate ENT: No sinus tenderness, TM clear, pale nasal mucosa, no oral exudate,no post nasal drip, no LAN Cardiac: S1, S2, regular rate and rhythm, no murmur Chest: No wheeze/ rales/ dullness; no accessory muscle use, no nasal flaring, no sternal retractions Abd.: Soft Non-tender, nondistended, bowel sounds positive,Body mass index is 23.49 kg/m.  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  Differential includes Nocardia, cancer, sarcoidosis.  Patient anxious about cancer. - Continue  Bactrim  as prescribed. - Coordinate with infectious disease specialist for repeat imaging to assess nodule size. - Consider repeat bronchoscopy for cultures if no significant change in nodule size. - Discuss with infectious disease specialist about the duration of Bactrim  therapy and timing of repeat imaging.  Type 2 diabetes mellitus Recent hypoglycemic episodes possibly related to Bactrim . Blood sugar 96 this morning  Chronic kidney disease, stage 3 GFR monitoring per ID while on Bactrim   AVS 07/25/2024 We had already reviewed your CT chest results, when we spoke on the phone 07/19/2024. The nodule of concern had decreased by 2 mm after a short course of treatment with Bactrim  Follow up with Dr. Dea as is scheduled next week. She will determine further lab draws and when to do repeat imaging. If she does not determine that, I will talk with Dr. Shelah about when we should repeat imaging to see if the area continues to shrink. >> 3 month follow up CT chest has been ordered  If there is no significant change, we will consider repeating the bronchoscopy. Continue Bactrim  as you have been doing.  Drink plenty of water. Follow up in 2 months. Call if you need us  sooner. Please contact office for sooner follow up if symptoms do not improve or worsen or seek emergency care     I spent 40 minutes dedicated to the care of this patient on the date of this encounter to include pre-visit review of records, face-to-face time with the patient discussing conditions above, post visit ordering of testing, clinical documentation with the electronic health record, making appropriate referrals as documented, and communicating necessary information to the patient's healthcare team.    Lauraine JULIANNA Lites, NP 07/25/2024  8:41 AM   Addendum I called the patient and explained that I spoke with Dr. Shelah and that we will do a 50-month follow-up CT to evaluate for continued response to antibiotic.  I told her  after she sees infectious disease if they would prefer  to do a follow-up scan earlier we are in support of that as I am unsure of what her plan is in regard to how long she plans to treat with the Bactrim .  Patient verbalized understanding and had no further questions or concerns at completion of the phone call.

## 2024-07-25 NOTE — Telephone Encounter (Signed)
 Reminder set to call patient in November.

## 2024-07-25 NOTE — Telephone Encounter (Signed)
 I attempted to call the patient and let her know we need to do follow up imaging CT Chest  3 months after the most recent scan to evaluate effectiveness of the Bactrim  per Dr. Shelah. This will fall the first week in December based on her last scan.

## 2024-07-25 NOTE — Patient Instructions (Signed)
 It is good to see you today. We had already reviewed your CT chest results, when we spoke on the phone 07/19/2024. The nodule of concern had decreased by 2 mm after a short course of treatment with Bactrim  Follow up with Dr. Dea as is scheduled next week. She will determine further lab draws and when to do repeat imaging. If she does not determine that, I will talk with Dr. Shelah about when we should repeat imaging to see if the area continues to shrink.  If there is no significant change, we will consider repeating the bronchoscopy. Continue Bactrim  as you have been doing.  Drink plenty of water. Follow up in 2 months. Call if you need us  sooner. Please contact office for sooner follow up if symptoms do not improve or worsen or seek emergency care

## 2024-07-26 ENCOUNTER — Telehealth: Payer: Self-pay

## 2024-07-26 NOTE — Telephone Encounter (Signed)
 Copied from CRM [id number]. Topic: General - Call Back - No Documentation >> Jul 25, 2024  2:25 PM Devaughn RAMAN wrote: Reason for CRM: Patient returning phone call, contacted [facility] spoke with Thersia stated only urgent calls due to staffing, sent CRM. Advised patient a follow up call would be made. Patient was thankful and verbalized understanding.  Follow up scheduled.NFN,called patient,patient informed

## 2024-07-31 DIAGNOSIS — R011 Cardiac murmur, unspecified: Secondary | ICD-10-CM | POA: Diagnosis not present

## 2024-07-31 DIAGNOSIS — R002 Palpitations: Secondary | ICD-10-CM | POA: Diagnosis not present

## 2024-08-01 ENCOUNTER — Ambulatory Visit (HOSPITAL_COMMUNITY)
Admission: RE | Admit: 2024-08-01 | Discharge: 2024-08-01 | Disposition: A | Discharge status: Home / Self Care | Source: Ambulatory Visit | Attending: Cardiovascular Disease | Admitting: Cardiovascular Disease

## 2024-08-01 DIAGNOSIS — R002 Palpitations: Secondary | ICD-10-CM

## 2024-08-01 DIAGNOSIS — R011 Cardiac murmur, unspecified: Secondary | ICD-10-CM

## 2024-08-01 LAB — ECHOCARDIOGRAM COMPLETE
AR max vel: 1.55 cm2
AV Area VTI: 1.47 cm2
AV Area mean vel: 1.53 cm2
AV Mean grad: 11 mmHg
AV Peak grad: 20.4 mmHg
Ao pk vel: 2.26 m/s
Area-P 1/2: 3.88 cm2
MV M vel: 6.04 m/s
MV Peak grad: 145.9 mmHg
Radius: 0.6 cm
S' Lateral: 2.1 cm

## 2024-08-02 ENCOUNTER — Other Ambulatory Visit: Payer: Self-pay

## 2024-08-02 ENCOUNTER — Ambulatory Visit (INDEPENDENT_AMBULATORY_CARE_PROVIDER_SITE_OTHER): Admitting: Infectious Diseases

## 2024-08-02 ENCOUNTER — Encounter: Payer: Self-pay | Admitting: Infectious Diseases

## 2024-08-02 VITALS — BP 149/79 | HR 75 | Temp 97.9°F | Ht 67.0 in | Wt 146.0 lb

## 2024-08-02 DIAGNOSIS — Z712 Person consulting for explanation of examination or test findings: Secondary | ICD-10-CM | POA: Diagnosis not present

## 2024-08-02 DIAGNOSIS — Z79899 Other long term (current) drug therapy: Secondary | ICD-10-CM

## 2024-08-02 DIAGNOSIS — A439 Nocardiosis, unspecified: Secondary | ICD-10-CM | POA: Diagnosis not present

## 2024-08-02 LAB — CBC
HCT: 40.6 % (ref 35.0–45.0)
Hemoglobin: 13.2 g/dL (ref 11.7–15.5)
MCH: 32.2 pg (ref 27.0–33.0)
MCHC: 32.5 g/dL (ref 32.0–36.0)
MCV: 99 fL (ref 80.0–100.0)
MPV: 9.1 fL (ref 7.5–12.5)
Platelets: 206 Thousand/uL (ref 140–400)
RBC: 4.1 Million/uL (ref 3.80–5.10)
RDW: 13.3 % (ref 11.0–15.0)
WBC: 4.1 Thousand/uL (ref 3.8–10.8)

## 2024-08-02 LAB — BASIC METABOLIC PANEL WITH GFR
BUN/Creatinine Ratio: 15 (calc) (ref 6–22)
BUN: 16 mg/dL (ref 7–25)
CO2: 29 mmol/L (ref 20–32)
Calcium: 10 mg/dL (ref 8.6–10.4)
Chloride: 102 mmol/L (ref 98–110)
Creat: 1.04 mg/dL — ABNORMAL HIGH (ref 0.60–0.95)
Glucose, Bld: 101 mg/dL — ABNORMAL HIGH (ref 65–99)
Potassium: 4.4 mmol/L (ref 3.5–5.3)
Sodium: 138 mmol/L (ref 135–146)
eGFR: 54 mL/min/1.73m2 — ABNORMAL LOW (ref >=60)

## 2024-08-02 MED ORDER — SULFAMETHOXAZOLE-TRIMETHOPRIM 800-160 MG PO TABS: 2.0000 | ORAL_TABLET | Freq: Two times a day (BID) | ORAL | 0 refills | Status: DC

## 2024-08-02 NOTE — Progress Notes (Signed)
 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     ezetimibe-simvastatin (VYTORIN) 10-40 MG per tablet Take 1 tablet by mouth daily.     loratadine (CLARITIN) 10 MG tablet Take 10 mg by mouth daily as needed for allergies.      Multiple Vitamin (MULTIVITAMIN) tablet Take 1 tablet by mouth daily.     ONETOUCH VERIO test strip 1 each 2 (two) times daily.     lisinopril-hydrochlorothiazide (ZESTORETIC) 20-12.5 MG tablet Take 1 tablet by mouth daily. (Patient not taking: Reported on 07/25/2024)     metFORMIN (GLUCOPHAGE) 500 MG tablet Take 500 mg by mouth 2 (two) times daily with a meal. (Patient not taking: Reported on 07/25/2024)     No current facility-administered medications on file prior to visit.    Subjective: Discussed the use of AI scribe software for clinical note transcription with the patient, who gave verbal consent to proceed.   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   She initially had a NM PET scan by Dr Jeffrie for evaluation of chest pain. Findings showed 1.7 cm spiculated nodule in the right middle lobe, suspicious for bronchogenic carcinoma.   This was followed by CT Chest which showed  Spiculated 10 x 17 x 13 mm right middle lobe pulmonary nodule, highly concerning for bronchogenic malignancy.  Heterogeneous nodular enlargement of the thyroid  with exophytic 3.5 cm retrosternal nodule  as above. Recommend non-emergent thyroid  ultrasound.  She was then referred to Pulmonary and underwent  Flexible video fiberoptic bronchoscopy and biopsies. Patient tells me some of the specimen got lost. Do not see any cultures were sent.   She denies any respiratory symptoms as such before the bronchoscopy like cough, SOB, fevers and chills. However, after the bronchoscopy, she started having cough and increased wheezing. She completed a course of Zpack and prednisone  taper with significant improvement.   She had a fu with Pulmonary on 8/4 where she was started on Bactrim  DS po bid for 21 days along with mucinex, flutter valve.   She also had an US  guided biopsy for the thyroid  nodule on 8/18. Path with benign follicular nodule.   Reports she is a pretty functional and highly active 78 year old woman and involved a lot of activities including volunteering. Denies any changes in appetite but reports weight down from 180 lbs to 147 lbs, with intentional dietary changes.   She experienced an episode of nausea and vomiting this Monday, accompanied by profuse sweating but no diarrhea/abdominal pain. No recent headaches, blurry vision, rashes, GU symptoms or neck pain, joint pain, although she has arthritis.   Denies smoking, alcohol, or recreational drug use. She lives alone and has two adult children. She denies being sexually active for many years.   Denies known h/o of any immunocompromising conditions or diseases or prolonged steroids.  9/25  Reports compliance with p.o. Bactrim  2 tablets twice daily.  However she feels sick and nauseous.  She is trying to keep herself hydrated as much as possible.  She also had low blood pressure as well as fluctuation in her blood glucose for which she is following PCP.  She is unable to expectorate any sputum.  She does not feel any different after taking antibiotics.   She has several questions regarding if she truly has nocardia pneumonia or something  else.  If she has nocardia pneumonia, how could she have guarded. [place], which she speculates could have exposed her to the infection.  9/25 Compliant with Bactrim  as prescribed but feels some sick and nauseous. Denies vomiting, diarrhea or rashes. She is hydrating herself as much as she can.   Seen by pulmonary NP 9/17 and plan to repeat CT chest in 3 months and consider repeat bronchoscopy if cultures are negative.   She talked to her granddaughter who is a respiratory therapist and recommended to get an expert opinion at [facility].  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 > 20 yr.   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 breast [facility])    Mass of finger of right hand 02/06/2014   thumb   Mild concentric left ventricular hypertrophy    Mitral valve regurgitation    Non-insulin dependent type 2 diabetes mellitus (HCC)    Osteoarthritis of hand    Pericardial effusion (noninflammatory) 11/08/2000   history of  - no longer sees cardiologist   Seasonal allergies    Past Surgical History:  Procedure Laterality Date   ABDOMINAL HYSTERECTOMY  1992   complete   CESAREAN SECTION  1969   EXCISION METACARPAL MASS Right 02/25/2014   Procedure: EXCISION MASS RIGHT THUMB  ;  Surgeon: Franky JONELLE Curia, MD;  Location: [facility];   Service: Orthopedics;  Laterality: Right;   FOOT NEUROMA SURGERY Left 2006   MASTECTOMY Left 04/03/2004   TOTAL MASTECTOMY Right 09/22/2006   with ax. node bx.   TRANSESOPHAGEAL ECHOCARDIOGRAM  08/23/2001   VIDEO BRONCHOSCOPY WITH ENDOBRONCHIAL NAVIGATION Right 06/05/2024   Procedure: VIDEO BRONCHOSCOPY WITH ENDOBRONCHIAL NAVIGATION;  Surgeon: Shelah Lamar RAMAN, MD;  Location: [facility];  Service: Pulmonary;  Laterality: Right;   Past Surgical History:  Procedure Laterality Date   ABDOMINAL HYSTERECTOMY  1992   complete   CESAREAN SECTION  1969   EXCISION METACARPAL MASS Right 02/25/2014   Procedure: EXCISION MASS RIGHT THUMB  ;  Surgeon: Franky JONELLE Curia, MD;  Location: [facility];  Service: Orthopedics;  Laterality: Right;   FOOT NEUROMA SURGERY Left 2006   MASTECTOMY Left 04/03/2004   TOTAL MASTECTOMY Right 09/22/2006   with ax. node bx.   TRANSESOPHAGEAL  ECHOCARDIOGRAM  08/23/2001   VIDEO BRONCHOSCOPY WITH ENDOBRONCHIAL NAVIGATION Right 06/05/2024   Procedure: VIDEO BRONCHOSCOPY WITH ENDOBRONCHIAL NAVIGATION;  Surgeon: Shelah Lamar RAMAN, MD;  Location: [facility];  Service: Pulmonary;  Laterality: Right;     Social History   Tobacco Use   Smoking status: Never    Passive exposure: Past   Smokeless tobacco: Never  Substance Use Topics   Alcohol use: No   Drug use: No    Family History  Problem Relation Age of Onset   Cancer - Other Mother    Cancer - Other Father    Stroke Father    CAD Father     No Known Allergies  Health Maintenance  Topic Date Due   Diabetic kidney evaluation - Urine ACR  Never done   DEXA SCAN  Never done   Medicare Annual Wellness (AWV)  12/09/2023   COVID-19 Vaccine (1) 08/10/2024 (Originally 02/10/1951)   Influenza Vaccine  02/05/2025 (Originally 06/08/2024)   Diabetic kidney evaluation - eGFR measurement  07/20/2025   DTaP/Tdap/Td (3 - Td or Tdap) 07/25/2030   Pneumococcal Vaccine: 50+ Years  Completed   Zoster Vaccines-  Shingrix  Completed   HPV VACCINES  Aged Out   Meningococcal B Vaccine  Aged Out   Colonoscopy  Discontinued    Objective: BP (!) 149/79   Pulse 75   Temp 97.9 F (36.6 C) (Oral)   Ht 5' 7 (1.702 m)   Wt 146 lb (66.2 kg)   SpO2 98%   BMI 22.87 kg/m '  Physical Exam Constitutional:      Appearance: Normal appearance.  HENT:     Head: Normocephalic and atraumatic.      Mouth: Mucous membranes are moist.  Eyes:    Conjunctiva/sclera: Conjunctivae normal.     Pupils: Pupils are equal, round, and b/l symmetrical    Cardiovascular:     Rate and Rhythm: Normal rate and regular rhythm.     Heart sounds:  Pulmonary:     Effort: Pulmonary effort is normal.     Breath sounds:  Abdominal:     General: Non distended     Palpations:   Musculoskeletal:        General: Normal range of motion.   Skin:    General: Skin is warm and dry.     Comments:  Neurological:     General: grossly non focal     Mental Status: awake, alert and oriented to person, place, and time.   Psychiatric:        Mood and Affect: Mood normal.   Lab Results Lab Results  Component Value Date   WBC 9.7 07/03/2024   HGB 14.1 07/03/2024   HCT 42.7 07/03/2024   MCV 96 07/03/2024   PLT 218 07/03/2024    Lab Results  Component Value Date   CREATININE 1.02 (H) 07/20/2024   BUN 14 07/20/2024   NA 137 07/20/2024   K 4.3 07/20/2024   CL 103 07/20/2024   CO2 26 07/20/2024   No results found for: ALT, AST, GGT, ALKPHOS, BILITOT  No results found for: CHOL, HDL, LDLCALC, LDLDIRECT, TRIG, CHOLHDL No results found for: LABRPR, RPRTITER No results found for: HIV1RNAQUANT, HIV1RNAVL, CD4TABS   Microbiology No results found for this or any previous visit.  Pathology  7/29 FINAL MICROSCOPIC DIAGNOSIS:  - No malignant cells identified  - Benign bronchial cells   FINAL MICROSCOPIC DIAGNOSIS:  A. LUNG, RML, FINE NEEDLE ASPIRATION:  - Negative for malignancy.  -  Filamentous bacteria, see comment.   Comment:  AFB stain highlights filamentous bacteria. 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 A GMS stain was attempted and  repeated but controls did not work and so are non-contributory. A HMB-45  stain was performed to evaluate a focal area of cells with a clear  appearance and is negative. Stain controls worked appropriately.  This case underwent intradepartmental consultation and Dr. [place]az[place]  concurs with the interpretation.  8/18 FINAL MICROSCOPIC DIAGNOSIS:  - Benign follicular nodule (Bethesda category II)   SPECIMEN ADEQUACY:  Satisfactory for evaluation   Imaging 07/06/24 MRI Brain IMPRESSION: 1. No acute intracranial abnormality. 2. Mild chronic white matter hyperintensity, most commonly due to chronic small vessel disease .  04/18/24 NM PET CT Cardiac perfusion IMPRESSION: 1.7 cm spiculated nodule in the right middle lobe, suspicious for bronchogenic carcinoma. Complete FDG PET-CT is recommended for further evaluation.  06/03/24 CT super D chest  IMPRESSION: 1. Spiculated 10 x 17 x 13 mm right middle lobe pulmonary nodule, highly concerning for bronchogenic malignancy. Correlation with PET scan or tissue sampling recommended. 2. Heterogeneous nodular enlargement of the thyroid  with exophytic 3.5 cm retrosternal nodule as above. Recommend non-emergent thyroid  ultrasound. Reference: J Am Coll Radiol. 2015 Feb;12(2): 143-50 3. Aortic Atherosclerosis (ICD10-I70.0). Coronary artery atherosclerosis.  06/05/24 CXR IMPRESSION: 1. No pneumothorax post biopsy. 2. A 2.4 cm nodule in the right mid lung field with associated biopsy clip.  06/13/24 US  thyroid   IMPRESSION: 1. Heterogeneous, enlarged and multinodular thyroid  gland most consistent with multinodular goiter. 2. Nodule # 3 is a 3.3 cm TI-RADS category 3 nodule in the  right lower gland and meets criteria to consider fine-needle aspiration biopsy. 3. No other nodules meet criteria for biopsy or imaging surveillance.   8/18 CT chest  IMPRESSION: Successful ultrasound guided FNA biopsy of a 3.3 cm RIGHT inferior TR-3 thyroid  nodule.  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 twice daily, refill sent - I again reemphasized her regarding keeping herself hydrated - Labs today - [facility] per family's request  - Patient to follow-up after being seen by outside ID  # AKI  - in the setting of Bactrim  use - discussed hydration as above - CR improving, BMP   # HIV screening  - discussed negative  # HCM - she reports receiving Flu vaccine  I spent 30 minutes involved in face-to-face and non-face-to-face activities for this patient on the day of the visit. Professional time spent includes the following activities: Preparing to see the patient (review of tests), Obtaining and reviewing separately obtained history (Last note from Pulmonary), Performing a medically appropriate examination and evaluation, Ordering medications/labs, referring and communicating with other health care professionals, Documenting clinical information in the EMR, Independently interpreting results (not separately reported), Communicating results to the patient, Counseling and educating the patient and Care coordination (not separately reported).   Of note, portions of this note may have been created with voice recognition software. While this note has been edited for accuracy, occasional wrong-word  or 'sound-a-like' substitutions  may have occurred due to the inherent limitations of voice recognition software.   Annalee Joseph, MD [facility] 08/02/2024, 9:08 AM

## 2024-08-03 DIAGNOSIS — Q386 Other congenital malformations of mouth: Secondary | ICD-10-CM | POA: Diagnosis not present

## 2024-08-03 NOTE — Progress Notes (Signed)
 Patient viewed in [place]  of her echo   Last read by Elveria GORMAN Jackolyn [name] at 9:12AM on 08/03/2024.

## 2024-08-03 NOTE — Progress Notes (Signed)
 Called patient to inform her about her monitor results. Patient stated that she just had oral surgery and was given Bactrium will take it for 6months been on it for 2 month and Norco 5/325 mg. Patient mention that she is not taken Lisinopril/HCTZ due to low bp and stop metformin due to low sugar for the past 4 weeks. Patient wants to know if you think it is safe to start the metoprolol succinate 25 mg to be taken at nighttime, due that her bp is stable. (Average Range about 115/70).  Did not send metoprolol succinate 25 mg due that patient to to know if it is ok for her to take it. Please advise.  Thank you

## 2024-08-04 ENCOUNTER — Ambulatory Visit: Payer: Self-pay | Admitting: Infectious Diseases

## 2024-08-07 DIAGNOSIS — E1169 Type 2 diabetes mellitus with other specified complication: Secondary | ICD-10-CM | POA: Diagnosis not present

## 2024-08-07 DIAGNOSIS — I340 Nonrheumatic mitral (valve) insufficiency: Secondary | ICD-10-CM | POA: Diagnosis not present

## 2024-08-07 DIAGNOSIS — E119 Type 2 diabetes mellitus without complications: Secondary | ICD-10-CM | POA: Diagnosis not present

## 2024-08-07 DIAGNOSIS — I10 Essential (primary) hypertension: Secondary | ICD-10-CM | POA: Diagnosis not present

## 2024-08-14 DIAGNOSIS — K5901 Slow transit constipation: Secondary | ICD-10-CM | POA: Diagnosis not present

## 2024-08-14 DIAGNOSIS — E1169 Type 2 diabetes mellitus with other specified complication: Secondary | ICD-10-CM | POA: Diagnosis not present

## 2024-08-14 DIAGNOSIS — I10 Essential (primary) hypertension: Secondary | ICD-10-CM | POA: Diagnosis not present

## 2024-08-14 DIAGNOSIS — E119 Type 2 diabetes mellitus without complications: Secondary | ICD-10-CM | POA: Diagnosis not present

## 2024-08-14 DIAGNOSIS — I340 Nonrheumatic mitral (valve) insufficiency: Secondary | ICD-10-CM | POA: Diagnosis not present

## 2024-08-14 DIAGNOSIS — R944 Abnormal results of kidney function studies: Secondary | ICD-10-CM | POA: Diagnosis not present

## 2024-08-22 DIAGNOSIS — N1831 Chronic kidney disease, stage 3a: Secondary | ICD-10-CM | POA: Diagnosis not present

## 2024-08-22 DIAGNOSIS — A439 Nocardiosis, unspecified: Secondary | ICD-10-CM | POA: Diagnosis not present

## 2024-08-22 DIAGNOSIS — A430 Pulmonary nocardiosis: Secondary | ICD-10-CM | POA: Diagnosis not present

## 2024-08-24 DIAGNOSIS — H31001 Unspecified chorioretinal scars, right eye: Secondary | ICD-10-CM | POA: Diagnosis not present

## 2024-08-24 DIAGNOSIS — H5203 Hypermetropia, bilateral: Secondary | ICD-10-CM | POA: Diagnosis not present

## 2024-08-24 DIAGNOSIS — H2513 Age-related nuclear cataract, bilateral: Secondary | ICD-10-CM | POA: Diagnosis not present

## 2024-08-24 DIAGNOSIS — H52203 Unspecified astigmatism, bilateral: Secondary | ICD-10-CM | POA: Diagnosis not present

## 2024-08-24 DIAGNOSIS — E119 Type 2 diabetes mellitus without complications: Secondary | ICD-10-CM | POA: Diagnosis not present

## 2024-09-03 ENCOUNTER — Encounter: Payer: Self-pay | Admitting: Infectious Diseases

## 2024-09-03 DIAGNOSIS — A439 Nocardiosis, unspecified: Secondary | ICD-10-CM

## 2024-09-03 MED ORDER — SULFAMETHOXAZOLE-TRIMETHOPRIM 800-160 MG PO TABS: 2.0000 | ORAL_TABLET | Freq: Two times a day (BID) | ORAL | 0 refills | Status: DC

## 2024-09-04 ENCOUNTER — Encounter: Payer: Self-pay | Admitting: Cardiology

## 2024-09-04 ENCOUNTER — Ambulatory Visit: Attending: Cardiology | Admitting: Cardiology

## 2024-09-04 VITALS — BP 133/82 | HR 79 | Ht 67.0 in | Wt 148.0 lb

## 2024-09-04 DIAGNOSIS — I10 Essential (primary) hypertension: Secondary | ICD-10-CM

## 2024-09-04 DIAGNOSIS — R002 Palpitations: Secondary | ICD-10-CM | POA: Diagnosis not present

## 2024-09-04 DIAGNOSIS — A439 Nocardiosis, unspecified: Secondary | ICD-10-CM

## 2024-09-04 DIAGNOSIS — R011 Cardiac murmur, unspecified: Secondary | ICD-10-CM | POA: Diagnosis not present

## 2024-09-04 NOTE — Patient Instructions (Signed)
 Medication Instructions:  The current medical regimen is effective;  continue present plan and medications.  *If you need a refill on your cardiac medications before your next appointment, please call your pharmacy*  Follow-Up: At [facility], you and your health needs are our priority.  As part of our continuing mission to provide you with exceptional heart care, our providers are all part of one team.  This team includes your primary Cardiologist (physician) and Advanced Practice Providers or APPs (Physician Assistants and Nurse Practitioners) who all work together to provide you with the care you need, when you need it.  Your next appointment:   6 month(s)  Provider:   Orren Fabry, PA       We recommend signing up for the patient portal called MyChart.  Sign up information is provided on this After Visit Summary.  MyChart is used to connect with patients for Virtual Visits (Telemedicine).  Patients are able to view lab/test results, encounter notes, upcoming appointments, etc.  Non-urgent messages can be sent to your provider as well.   To learn more about what you can do with MyChart, go to [email or web address].

## 2024-09-04 NOTE — Progress Notes (Signed)
 Cardiology Office Note:  .   Date:  09/04/2024  ID:  Lisa Bradshaw, DOB September 09, 1946, MRN [id number] PCP: Claudene Pellet, MD  [facility] Providers Cardiologist:  Oneil Parchment, MD     History of Present Illness: .   Lisa Bradshaw is a 78 y.o. female Discussed the use of AI scribe software for clinical note transcription with the patient, who gave verbal consent to proceed.  History of Present Illness Lisa Bradshaw is an 78 year old female who presents with concerns about medication side effects and kidney function.  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 She started treatment on August 5th with an antibiotic regimen, initially taking two pills a day, which was later increased to four pills a day. She has taken almost 300 pills since starting the treatment.  The antibiotic treatment has affected her kidney function. Her GFR dropped from 73 in February to 46 in August after starting the medication, and her creatinine level was 1.04 on September 25th. She mentions a previous creatinine level of 1.17, which was higher than her baseline of 0.78 in July.  She has experienced fluctuations in her blood pressure and blood sugar levels, which she attributes to the medication. She is no longer taking metformin for blood sugar control. She also feels nauseous and dizzy, particularly when taking lisinopril, which she has since stopped.  She has a history of thyroid  issues, with a biopsy in March showing no cancer. She is scheduled to see a thyroid  specialist on November 12th. Additionally, she underwent an echocardiogram and a monitor test.  She has experienced palpitations and skipped heartbeats, for which metoprolol was considered to manage these symptoms. However, she is concerned about the impact on her blood pressure, which has been  low at times, with readings as low as 99/54.  She has been taking a probiotic to manage gastrointestinal side effects from the antibiotic treatment. She also underwent a brain MRI which was normal.      Studies Reviewed: .        Results LABS Creatinine: 1.04 (08/02/2024) Creatinine: 0.78 (05/2024) Creatinine: 1.28 Creatinine: 1.17  RADIOLOGY Brain MRI: No abnormalities  DIAGNOSTIC Echocardiogram: Normal systolic function Cardiac monitor: No atrial fibrillation  PATHOLOGY Thyroid  biopsy: No malignancy (01/2024) Risk Assessment/Calculations:            Physical Exam:   VS:  BP 133/82   Pulse 79   Ht 5' 7 (1.702 m)   Wt 148 lb (67.1 kg)   SpO2 96%   BMI 23.18 kg/m    Wt Readings from Last 3 Encounters:  09/04/24 148 lb (67.1 kg)  08/02/24 146 lb (66.2 kg)  07/25/24 150 lb (68 kg)    GEN: Well nourished, well developed in no acute distress NECK: No JVD; No carotid bruits CARDIAC: RRR, no murmurs, no rubs, no gallops RESPIRATORY:  Clear to auscultation without rales, wheezing or rhonchi  ABDOMEN: Soft, non-tender, non-distended EXTREMITIES:  No edema; No deformity   ASSESSMENT AND PLAN: .    Assessment and Plan Assessment & Plan Palpitations with premature beats and heart murmur Palpitations with premature beats and a heart murmur. No evidence of atrial fibrillation on recent monitoring, thus no need for anticoagulation. Metoprolol was considered to manage palpitations but not initiated due to concerns about blood pressure. - Monitor symptoms and consider metoprolol if  palpitations worsen or become more symptomatic.  Hypertension with recent medication adjustments and hypotensive episodes Hypertension with recent medication adjustments. Lisinopril was discontinued due to hypotensive episodes, with blood pressure readings as low as 99/54 mmHg. Metoprolol was considered but not started due to potential further lowering of blood pressure. - Avoid starting new  antihypertensive medications at this time.  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 Creatinine levels have varied, with a recent reading of 1.04 mg/dL, indicating stabilization. The GFR has also fluctuated but is expected to improve post-antibiotic therapy. She has experienced side effects such as nausea and elevated blood pressure, which are known effects of Bactrim . - Continue Bactrim  therapy as prescribed by infectious disease specialist. - Monitor renal function periodically to assess recovery post-antibiotic therapy. - Continue probiotic to manage gastrointestinal side effects.  Type 2 diabetes mellitus, not currently on medication Type 2 diabetes mellitus currently not on medication. Blood sugar levels have been affected by Bactrim , leading to discontinuation of metformin.         Dispo: 1 yr  Signed, Oneil Parchment, MD

## 2024-09-07 DIAGNOSIS — I10 Essential (primary) hypertension: Secondary | ICD-10-CM | POA: Diagnosis not present

## 2024-09-07 DIAGNOSIS — I340 Nonrheumatic mitral (valve) insufficiency: Secondary | ICD-10-CM | POA: Diagnosis not present

## 2024-09-07 DIAGNOSIS — N1831 Chronic kidney disease, stage 3a: Secondary | ICD-10-CM | POA: Diagnosis not present

## 2024-09-07 DIAGNOSIS — E1169 Type 2 diabetes mellitus with other specified complication: Secondary | ICD-10-CM | POA: Diagnosis not present

## 2024-09-07 DIAGNOSIS — E119 Type 2 diabetes mellitus without complications: Secondary | ICD-10-CM | POA: Diagnosis not present

## 2024-09-13 DIAGNOSIS — E1169 Type 2 diabetes mellitus with other specified complication: Secondary | ICD-10-CM | POA: Diagnosis not present

## 2024-09-13 DIAGNOSIS — E119 Type 2 diabetes mellitus without complications: Secondary | ICD-10-CM | POA: Diagnosis not present

## 2024-09-13 DIAGNOSIS — I10 Essential (primary) hypertension: Secondary | ICD-10-CM | POA: Diagnosis not present

## 2024-09-13 DIAGNOSIS — I340 Nonrheumatic mitral (valve) insufficiency: Secondary | ICD-10-CM | POA: Diagnosis not present

## 2024-09-24 NOTE — Progress Notes (Signed)
 "  History of Present Illness Lisa Bradshaw is a 78 y.o. female never smoker referred for lung nodule consult after incidental finding of a 1.7 cm spiculated pulmonary nodule noted on a cardiac PET scan. She will be followed by Dr. Shelah.    09/25/2024 Discussed the use of AI scribe software for clinical note transcription with the patient, who gave verbal consent to proceed.  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    She initially had a NM PET scan by Dr Jeffrie for evaluation of chest pain. Findings showed 1.7 cm spiculated nodule in the right middle lobe, suspicious for bronchogenic carcinoma.    This was followed by CT Chest which showed  Spiculated 10 x 17 x 13 mm right middle lobe pulmonary nodule, highly concerning for bronchogenic malignancy.   Heterogeneous nodular enlargement of the thyroid  with exophytic 3.5 cm retrosternal nodule as above. Recommend non-emergent thyroid  ultrasound.   She was then referred to Pulmonary and underwent  Flexible video fiberoptic bronchoscopy and biopsies by Dr. Shelah on 06/05/2024. 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 She was started on Bactrim  immediately, and referred to Infectious disease.    She denied any respiratory symptoms as such before the bronchoscopy like cough, SOB, fevers and chills. However, after the bronchoscopy, she started having cough and increased wheezing. She completed a course of Zpack and prednisone  taper with significant improvement.    She had a fu with Pulmonary on 8/4 where she was started on Bactrim  DS po bid for 21 days along with mucinex, flutter valve.   She was then seen by ID, and her dose of bactrim  was increased to 2 tablets twice daily. This has caused her some GI upset. Plan is for  imaging 3 moths into Bactrim  treatment to see if there is response to treatment in decreased size of nodule.   She also had an US  guided biopsy for the thyroid  nodule on 8/18. Path with benign follicular nodule.    Reports she is a pretty functional and highly active 78 year old woman and involved a lot of activities including volunteering. Denies any changes in appetite but reports weight down from 180 lbs to 147 lbs, with intentional dietary changes.      Denies smoking, alcohol, or recreational drug use. She lives alone and has two adult children.  She denies being sexually active for many years.    Denies known h/o of any immunocompromising conditions or diseases or prolonged steroids.    History of Present Illness Lisa Bradshaw is an 78 year old female who presents for follow up of ongoing treatment for Nocardia ( biopsy results 06/05/2024) . She has been on Bactrim  since 07/25/2024, but ID did increase dose from one tablet BID to 2 tablets BID.  She reports stomach upset and constipation since starting the medication, for which she takes Miralax. She also notes a decrease in physical activity due to fatigue,decreased energy levels which she believes may be contributing to her weight gain.Her lack of energy levels have significantly decreased, impacting her daily life and activities. She used to enjoy walking but is now less active. She is currently taking a probiotic, Culturelle, daily.  Her respiratory symptoms have improved, and she reports no significant cough at present. She has been on her  current treatment for approximately three and a half months.  Plan was for a 3 month follow up scan. She has been on medication for 3 months, so we will schedule the follow up scan this week. She has follow up with ID on Thursday. We are obviously hoping for good treatment response.  She is being followed by her PCP for monitoring of her renal function. She is monitoring her blood sugars very  carefully while on Bactrim . She is very detail oriented, and manages her healthcare very well.      Test Results: No new imaging   Cytology  06/25/2024 Thyroid  nodule A. THYROID , RT INFERIOR, FINE NEEDLE ASPIRATION    FINAL MICROSCOPIC DIAGNOSIS:  - Benign follicular nodule ([facility]hes[employer] category II)     A. LUNG, RML, FINE NEEDLE ASPIRATION:  - Negative for malignancy.  - Filamentous bacteria, see comment.   Comment:  AFB stain highlights filamentous bacteria. 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 A GMS stain was attempted and  repeated but controls did not work and so are non-contributory. A HMB-45  stain was performed to evaluate a focal area of cells with a clear  appearance and is negative. Stain controls worked appropriately.  This case underwent intradepartmental consultation and Dr. Jordan  concurs with the interpretation   B. LUNG, RML, LAVAGE:    FINAL MICROSCOPIC DIAGNOSIS:  - No malignant cells identified  - Benign bronchial cells     Latest Ref Rng & Units 08/02/2024    9:13 AM 07/03/2024    3:49 PM 06/05/2024    5:48 AM  CBC  WBC 3.8 - 10.8 Thousand/uL 4.1  9.7  5.8   Hemoglobin 11.7 - 15.5 g/dL 86.7  85.8  85.9   Hematocrit 35.0 - 45.0 % 40.6  42.7  41.6   Platelets 140 - 400 Thousand/uL 206  218  196        Latest Ref Rng & Units 08/02/2024    9:13 AM 07/20/2024    9:59 AM 07/10/2024   10:38 AM  BMP  Glucose 65 - 99 mg/dL 898  897  895   BUN 7 - 25 mg/dL 16  14  19    Creatinine 0.60 - 0.95 mg/dL 8.95  8.97  8.82   BUN/Creat Ratio 6 - 22 (calc) 15  14  16    Sodium 135 - 146 mmol/L 138  137  138   Potassium 3.5 - 5.3 mmol/L 4.4  4.3  4.4   Chloride 98 - 110 mmol/L 102  103  102   CO2 20 - 32 mmol/L 29  26  26    Calcium 8.6 - 10.4 mg/dL 89.9  89.6  89.5     BNP No results found for: BNP  ProBNP No results found for: PROBNP  PFT No results  found for: FEV1PRE, FEV1POST, FVCPRE, FVCPOST, TLC, DLCOUNC, PREFEV1FVCRT, PSTFEV1FVCRT  No results found.   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 > 20 yr.   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 breast [facility])    Mass of finger of right hand 02/06/2014   thumb   Mild concentric left ventricular hypertrophy    Mitral valve regurgitation    Non-insulin dependent type 2 diabetes mellitus (HCC)    Osteoarthritis of hand    Pericardial effusion (noninflammatory) 11/08/2000   history of  - no longer sees cardiologist   Seasonal allergies      Social History   Tobacco Use   Smoking status: Never    Passive exposure: Past   Smokeless tobacco: Never  Substance Use Topics   Alcohol use: No   Drug use: No    Lisa Bradshaw reports that she has never smoked. She has been exposed to tobacco smoke. She has never used smokeless tobacco. She reports that she does not drink alcohol and does not use drugs.  Tobacco Cessation: Counseling given: Not Answered   Past surgical hx, Family hx, Social hx all reviewed.  Current Outpatient Medications on File Prior to Visit  Medication Sig   aspirin 81 MG tablet Take 81 mg by mouth daily.   ezetimibe-simvastatin (VYTORIN) 10-40 MG per tablet Take 1 tablet by mouth daily.   loratadine (CLARITIN) 10 MG tablet Take 10 mg by mouth daily as needed for allergies.    Multiple Vitamin (MULTIVITAMIN) tablet Take 1 tablet by mouth  daily.   ONETOUCH VERIO test strip 1 each 2 (two) times daily.   sulfamethoxazole -trimethoprim  (BACTRIM  DS) 800-160 MG tablet Take 2 tablets by mouth 2 (two) times daily.   No current facility-administered medications on file prior to visit.     No Known Allergies  Review Of Systems:  Constitutional:   No  weight loss, night sweats,  Fevers, chills, + fatigue, or  lassitude.  HEENT:   No headaches,  Difficulty swallowing,  Tooth/dental problems, or  Sore throat,                No sneezing, itching, ear ache, nasal congestion, post nasal drip,   CV:  No chest pain,  Orthopnea, PND, swelling in lower extremities, anasarca, dizziness, palpitations, syncope.   GI + heartburn, +indigestion, + abdominal pain,+  nausea, No vomiting, diarrhea, + change in bowel habits ( constipation), No loss of appetite, bloody stools.   Resp: + but improving  shortness of breath with exertion,  none  at rest.  No excess mucus, no productive cough,  No non-productive cough,  No coughing up of blood.  No change in color of mucus.  No wheezing.  No chest wall deformity  Skin: no rash or lesions.  GU: no dysuria, change in color of urine, no urgency or frequency.  No flank pain, no hematuria   MS:  No joint pain or swelling.  No decreased range of motion.  No back pain.  Psych:  No change in mood or affect. No depression or anxiety.  No memory loss.   Vital Signs BP (!) 148/72 (BP Location: Left Arm)   Pulse 76   Temp 98.1 F (36.7 C) (Oral)   Ht 5' 7 (1.702 m)   Wt 153 lb 9.6 oz (69.7 kg)   SpO2 97%   BMI 24.06 kg/m    Physical Exam GENERAL: No distress, alert and oriented times 3. EARS  NOSE THROAT: No sinus tenderness, tympanic membranes clear, pale nasal mucosa, no oral exudate, no post nasal drip, no lymphadenopathy. CHEST: No wheeze, rales, dullness, no accessory muscle use, no nasal flaring, no sternal retractions. CARDIAC: S1, S2, regular rate and rhythm, no murmur. ABDOMINAL: Soft, non  tender. ND, BS present, EXTREMITIES: No clubbing, cyanosis, edema. No obvious deformities NEUROLOGICAL: Normal strength. Alert and oriented x 3, MAE x 4 SKIN: No rashes, warm and dry. No obvious skin lesions PSYCHIATRIC: Normal mood and behavior.   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 - Schedule CT chest to evaluate treatment response. - Follow up after CT Chest to review results - Follow up with infectious disease as scheduled.  Adverse effects of Bactrim  including gastrointestinal symptoms, fatigue, and weight gain Experiencing gastrointestinal symptoms, fatigue, and weight gain since starting Bactrim .  Concerns about medication impact on kidney function and overall health.  Blood sugar and blood pressure affected.  Goal is to discontinue Bactrim  when  possible. - Schedule CT chest to assess treatment response. - Follow up with infectious disease to discuss potential discontinuation of Bactrim  if imaging supports .  Chronic kidney disease with declining eGFR eGFR declined from 73 to 44, creatinine at 1.17.  Concerns about long-term kidney function  - Schedule CT chest to evaluate treatment response. - Follow up with infectious disease to discuss potential discontinuation of Bactrim  if appropriate - Follow up with Dr. Claudene who is monitoring renal function/ Consider renal referral. .  Constipation Experiencing constipation, likely related to Bactrim  use. - Continue Miralax for constipation management.   I spent 40 minutes dedicated to the care of this patient on the date of this encounter to include pre-visit review of records, face-to-face time with the patient discussing conditions above, post visit ordering of testing, clinical documentation with the electronic health record, making appropriate referrals as documented, and communicating necessary information to the  patient's healthcare team.     Lauraine JULIANNA Lites, NP 09/25/2024  1:32 PM          "

## 2024-09-25 ENCOUNTER — Encounter: Payer: Self-pay | Admitting: Acute Care

## 2024-09-25 ENCOUNTER — Ambulatory Visit: Admitting: Acute Care

## 2024-09-25 VITALS — BP 148/72 | HR 76 | Temp 98.1°F | Ht 67.0 in | Wt 153.6 lb

## 2024-09-25 DIAGNOSIS — R9389 Abnormal findings on diagnostic imaging of other specified body structures: Secondary | ICD-10-CM | POA: Diagnosis not present

## 2024-09-25 DIAGNOSIS — R911 Solitary pulmonary nodule: Secondary | ICD-10-CM

## 2024-09-25 NOTE — Patient Instructions (Addendum)
 It is good to see you today. It has been 3.5  months since treatment started. We will go ahead and get the Ct chest scheduled.  You should get a call to get this scheduled. Follow up with me 1-2 weeks after scan to review results. I am glad your respiratory symptoms are better.  Follow up with Infectious disease as is scheduled.  Hopefully we will see a big change in the size of the nodule.  Call if you need us  sooner.  Please contact office for sooner follow up if symptoms do not improve or worsen or seek emergency care

## 2024-09-27 ENCOUNTER — Ambulatory Visit: Admitting: Infectious Diseases

## 2024-09-27 ENCOUNTER — Other Ambulatory Visit: Payer: Self-pay

## 2024-09-27 VITALS — BP 161/85 | HR 80 | Temp 98.5°F | Wt 154.0 lb

## 2024-09-27 DIAGNOSIS — Z5181 Encounter for therapeutic drug level monitoring: Secondary | ICD-10-CM | POA: Insufficient documentation

## 2024-09-27 DIAGNOSIS — I10 Essential (primary) hypertension: Secondary | ICD-10-CM | POA: Insufficient documentation

## 2024-09-27 DIAGNOSIS — N179 Acute kidney failure, unspecified: Secondary | ICD-10-CM

## 2024-09-27 DIAGNOSIS — R918 Other nonspecific abnormal finding of lung field: Secondary | ICD-10-CM

## 2024-09-27 DIAGNOSIS — A439 Nocardiosis, unspecified: Secondary | ICD-10-CM

## 2024-09-27 DIAGNOSIS — E119 Type 2 diabetes mellitus without complications: Secondary | ICD-10-CM

## 2024-09-27 MED ORDER — SULFAMETHOXAZOLE-TRIMETHOPRIM 800-160 MG PO TABS: 2.0000 | ORAL_TABLET | Freq: Two times a day (BID) | ORAL | 0 refills | Status: DC

## 2024-09-27 NOTE — Progress Notes (Signed)
 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     ezetimibe-simvastatin (VYTORIN) 10-40 MG per tablet Take 1 tablet by mouth daily.     loratadine (CLARITIN) 10 MG tablet Take 10 mg by mouth daily as needed for allergies.      Multiple Vitamin (MULTIVITAMIN) tablet Take 1 tablet by mouth daily.     ONETOUCH VERIO test strip 1 each 2 (two) times daily.     sulfamethoxazole -trimethoprim  (BACTRIM  DS) 800-160 MG tablet Take 2 tablets by mouth 2 (two) times daily. 120 tablet 0   No current facility-administered medications on file prior to visit.    Subjective: Discussed the use of AI scribe software for clinical note transcription with the patient, who gave verbal consent to proceed.   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   She initially had a NM PET scan by Dr Jeffrie for evaluation of chest pain. Findings showed 1.7 cm spiculated nodule in the right middle lobe, suspicious for bronchogenic carcinoma.   This was followed by CT Chest which showed  Spiculated 10 x 17 x 13 mm right middle lobe pulmonary nodule, highly concerning for bronchogenic malignancy.  Heterogeneous nodular enlargement of the thyroid  with exophytic 3.5 cm retrosternal nodule as above. Recommend non-emergent thyroid  ultrasound.  She was then referred to Pulmonary and underwent   Flexible video fiberoptic bronchoscopy and biopsies. Patient tells me some of the specimen got lost. Do not see any cultures were sent.   She denies any respiratory symptoms as such before the bronchoscopy like cough, SOB, fevers and chills. However, after the bronchoscopy, she started having cough and increased wheezing. She completed a course of Zpack and prednisone  taper with significant improvement.   She had a fu with Pulmonary on 8/4 where she was started on Bactrim  DS po bid for 21 days along with mucinex, flutter valve.   She also had an US  guided biopsy for the thyroid  nodule on 8/18. Path with benign follicular nodule.   Reports she is a pretty functional and highly active 79 year old woman and involved a lot of activities including volunteering. Denies any changes in appetite but reports weight down from 180 lbs to 147 lbs, with intentional dietary changes.   She experienced an episode of nausea and vomiting this Monday, accompanied by profuse sweating but no diarrhea/abdominal pain. No recent headaches, blurry vision, rashes, GU symptoms or neck pain, joint pain, although she has arthritis.   Denies smoking, alcohol, or recreational drug use. She lives alone and has two adult children. She denies being sexually active for many years.   Denies known h/o of any immunocompromising conditions or diseases or prolonged steroids.  9/25  Reports compliance with p.o. Bactrim  2 tablets twice daily.  However she feels sick and nauseous.  She is trying to keep herself hydrated as much as possible.  She  also had low blood pressure as well as fluctuation in her blood glucose for which she is following PCP.  She is unable to expectorate any sputum.  She does not feel any different after taking antibiotics.   She has several questions regarding if she truly has nocardia pneumonia or something else.  If she has nocardia pneumonia, how could she have guarded. [place], which she speculates could have exposed her to the infection.  Seen by pulmonary NP 9/17 and plan to repeat CT chest in 3 months and consider repeat bronchoscopy if cultures are negative.   She talked to her granddaughter who is a respiratory therapist and recommended to get an expert opinion at [facility].  11/20 She was seen by Dr. Norman on 10/15 at [facility] for second opinion who recommended to continue Bactrim  for probable 6 months.  Per their notes, patient wished to follow-up with them but today patient states, she wants to follow-up with us  as ?  did not get refill of meds and no changes in treatment plan was made.   She reports compliance with p.o. Bactrim  with no missed doses, however feels fatigued, nauseous.  She is drinking 60 to 70 ounce of water every day and feels like gaining weight, denies any vomiting, abdominal pain or diarrhea. No significant change in breathing but mild cough.  She reports BP running high and following with PCP/cardiology. Reports receiving flu vaccine.   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 > 20 yr.   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 breast [facility])    Mass of finger of right hand 02/06/2014   thumb   Mild concentric left ventricular hypertrophy    Mitral valve regurgitation    Non-insulin dependent type 2 diabetes mellitus (HCC)     Osteoarthritis of hand    Pericardial effusion (noninflammatory) 11/08/2000   history of  - no longer sees cardiologist   Seasonal allergies    Past Surgical History:  Procedure Laterality Date   ABDOMINAL HYSTERECTOMY  1992   complete   CESAREAN SECTION  1969   EXCISION METACARPAL MASS Right 02/25/2014   Procedure: EXCISION MASS RIGHT THUMB  ;  Surgeon: Franky JONELLE Curia, MD;  Location: [facility];  Service: Orthopedics;  Laterality: Right;   FOOT NEUROMA SURGERY Left 2006   MASTECTOMY Left 04/03/2004   TOTAL MASTECTOMY Right 09/22/2006   with ax. node bx.   TRANSESOPHAGEAL ECHOCARDIOGRAM  08/23/2001   VIDEO BRONCHOSCOPY WITH ENDOBRONCHIAL NAVIGATION Right 06/05/2024   Procedure: VIDEO BRONCHOSCOPY WITH ENDOBRONCHIAL NAVIGATION;  Surgeon: Shelah Lamar RAMAN, MD;  Location: [facility] END[facility]COPY;  Service: Pulmonary;  Laterality: Right;   Past Surgical History:  Procedure Laterality Date  ABDOMINAL HYSTERECTOMY  1992   complete   CESAREAN SECTION  1969   EXCISION METACARPAL MASS Right 02/25/2014   Procedure: EXCISION MASS RIGHT THUMB  ;  Surgeon: Franky JONELLE Curia, MD;  Location: [facility];  Service: Orthopedics;  Laterality: Right;   FOOT NEUROMA SURGERY Left 2006   MASTECTOMY Left 04/03/2004   TOTAL MASTECTOMY Right 09/22/2006   with ax. node bx.   TRANSESOPHAGEAL ECHOCARDIOGRAM  08/23/2001   VIDEO BRONCHOSCOPY WITH ENDOBRONCHIAL NAVIGATION Right 06/05/2024   Procedure: VIDEO BRONCHOSCOPY WITH ENDOBRONCHIAL NAVIGATION;  Surgeon: Shelah Lamar RAMAN, MD;  Location: [facility] ENDOSCOPY;  Service: Pulmonary;  Laterality: Right;     Social History   Tobacco Use   Smoking status: Never    Passive exposure: Past   Smokeless tobacco: Never  Substance Use Topics   Alcohol use: No   Drug use: No    Family History  Problem Relation Age of Onset   Cancer - Other Mother    Cancer - Other Father    Stroke Father    CAD Father     No Known Allergies  Health Maintenance   Topic Date Due   Diabetic kidney evaluation - Urine ACR  Never done   Mammogram  05/05/2007   Bone Density Scan  Never done   Medicare Annual Wellness (AWV)  12/09/2023   COVID-19 Vaccine (1) 10/11/2024 (Originally 02/10/1951)   Influenza Vaccine  02/05/2025 (Originally 06/08/2024)   Diabetic kidney evaluation - eGFR measurement  08/02/2025   DTaP/Tdap/Td (3 - Td or Tdap) 07/25/2030   Pneumococcal Vaccine: 50+ Years  Completed   Zoster Vaccines- Shingrix  Completed   Meningococcal B Vaccine  Aged Out   Colonoscopy  Discontinued    Objective: BP (!) 161/85   Pulse 80   Temp 98.5 F (36.9 C) (Oral)   Wt 154 lb (69.9 kg)   SpO2 96%   BMI 24.12 kg/m    Physical Exam Constitutional:      Appearance: Normal appearance.  HENT:     Head: Normocephalic and atraumatic.      Mouth: Mucous membranes are moist.  Eyes:    Conjunctiva/sclera: Conjunctivae normal.     Pupils: Pupils are equal, round, and b/l symmetrical    Cardiovascular:     Rate and Rhythm: Normal rate     Heart sounds:  Pulmonary:     Effort: Pulmonary effort is normal.     Breath sounds:  Abdominal:     General: Non distended     Palpations:   Musculoskeletal:        General: Normal range of motion.   Skin:    General: Skin is warm and dry.     Comments:  Neurological:     General: grossly non focal     Mental Status: awake, alert and oriented to person, place, and time.   Psychiatric:        Mood and Affect: Mood normal.   Lab Results Lab Results  Component Value Date   WBC 4.1 08/02/2024   HGB 13.2 08/02/2024   HCT 40.6 08/02/2024   MCV 99.0 08/02/2024   PLT 206 08/02/2024    Lab Results  Component Value Date   CREATININE 1.04 (H) 08/02/2024   BUN 16 08/02/2024   NA 138 08/02/2024   K 4.4 08/02/2024   CL 102 08/02/2024   CO2 29 08/02/2024   No results found for: ALT, AST, GGT, ALKPHOS, BILITOT  No results found for: CHOL, HDL, LDLCALC, LDLDIRECT,  TRIG,  CHOLHDL No results found for: LABRPR, RPRTITER No results found for: HIV1RNAQUANT, HIV1RNAVL, CD4TABS   Microbiology No results found for this or any previous visit.  Pathology  7/29 FINAL MICROSCOPIC DIAGNOSIS:  - No malignant cells identified  - Benign bronchial cells   FINAL MICROSCOPIC DIAGNOSIS:  A. LUNG, RML, FINE NEEDLE ASPIRATION:  - Negative for malignancy.  - Filamentous bacteria, see comment.   Comment:  AFB stain highlights filamentous bacteria. 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 A GMS stain was attempted and  repeated but controls did not work and so are non-contributory. A HMB-45  stain was performed to evaluate a focal area of cells with a clear  appearance and is negative. Stain controls worked appropriately.  This case underwent intradepartmental consultation and Dr. Jordan  concurs with the interpretation.  8/18 FINAL MICROSCOPIC DIAGNOSIS:  - Benign follicular nodule (Bethesda category II)   SPECIMEN ADEQUACY:  Satisfactory for evaluation   Imaging 07/06/24 MRI Brain IMPRESSION: 1. No acute intracranial abnormality. 2. Mild chronic white matter hyperintensity, most commonly due to chronic small vessel disease .  04/18/24 NM PET CT Cardiac perfusion IMPRESSION: 1.7 cm spiculated nodule in the right middle lobe, suspicious for bronchogenic carcinoma. Complete FDG PET-CT is recommended for further evaluation.  06/03/24 CT super D chest  IMPRESSION: 1. Spiculated 10 x 17 x 13 mm right middle lobe pulmonary nodule, highly concerning for bronchogenic malignancy. Correlation with PET scan or tissue sampling recommended. 2. Heterogeneous nodular enlargement of the thyroid  with exophytic 3.5 cm retrosternal nodule as above. Recommend non-emergent thyroid  ultrasound. Reference: J Am Coll Radiol. 2015 Feb;12(2): 143-50 3. Aortic  Atherosclerosis (ICD10-I70.0). Coronary artery atherosclerosis.  06/05/24 CXR IMPRESSION: 1. No pneumothorax post biopsy. 2. A 2.4 cm nodule in the right mid lung field with associated biopsy clip.  06/13/24 US  thyroid   IMPRESSION: 1. Heterogeneous, enlarged and multinodular thyroid  gland most consistent with multinodular goiter. 2. Nodule # 3 is a 3.3 cm TI-RADS category 3 nodule in the right lower gland and meets criteria to consider fine-needle aspiration biopsy. 3. No other nodules meet criteria for biopsy or imaging surveillance.   8/18 CT chest  IMPRESSION: Successful ultrasound guided FNA biopsy of a 3.3 cm RIGHT inferior TR-3 thyroid  nodule.  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 twice daily, refill sent - She is continuing to hydrate and I have reemphasized to continue doing so while on Bactrim   - Labs today - Fu in 2-3 weeks to review CT chest and monitoring Cr - Fu CT Chest 11/21  - Fu with Pulmonary as planned    # AKI  - in the setting of Bactrim  use -10/15 Cr 1.17 - BMP today  - Hydration as above   # Fluctuation in BP - she has been taken off of anti hypertensives due to fluctuating BP while on Bactrim   - advised to closely fu with PCP/cardiology   # Type 2 DM - Metformin was discontinued due to fluctuations in blood glucose  - Fu with PCP  # Immunization counseling - Reports getting flu vaccine  I  spent 30 minutes involved in face-to-face and non-face-to-face activities for this patient on the day of the visit. Professional time spent includes the following activities: Preparing to see the patient (review of  tests), Obtaining and reviewing separately obtained history (Pulmonary note 11/18, Cardiologynote 10/28, ID note 10/15), Performing a medically appropriate examination and evaluation, Ordering medications/labs, Documenting clinical information in the EMR, Independently interpreting results (not separately reported), Communicating results to the patient, Counseling and educating the patient and Care coordination (not separately reported).   Of note, portions of this note may have been created with voice recognition software. While this note has been edited for accuracy, occasional wrong-word or 'sound-a-like' substitutions may have occurred due to the inherent limitations of voice recognition software.   Annalee Joseph, MD [facility] for Infectious Disease [facility] 09/27/2024, 2:28 PM

## 2024-09-28 ENCOUNTER — Ambulatory Visit (HOSPITAL_BASED_OUTPATIENT_CLINIC_OR_DEPARTMENT_OTHER)
Admission: RE | Admit: 2024-09-28 | Discharge: 2024-09-28 | Disposition: A | Discharge status: Home / Self Care | Source: Ambulatory Visit | Attending: Acute Care | Admitting: Acute Care

## 2024-09-28 DIAGNOSIS — R911 Solitary pulmonary nodule: Secondary | ICD-10-CM | POA: Diagnosis not present

## 2024-09-28 DIAGNOSIS — R918 Other nonspecific abnormal finding of lung field: Secondary | ICD-10-CM | POA: Diagnosis not present

## 2024-09-28 DIAGNOSIS — I3139 Other pericardial effusion (noninflammatory): Secondary | ICD-10-CM | POA: Diagnosis not present

## 2024-09-28 DIAGNOSIS — E041 Nontoxic single thyroid nodule: Secondary | ICD-10-CM | POA: Diagnosis not present

## 2024-09-28 LAB — BASIC METABOLIC PANEL WITH GFR
BUN/Creatinine Ratio: 13 (calc) (ref 6–22)
BUN: 16 mg/dL (ref 7–25)
CO2: 28 mmol/L (ref 20–32)
Calcium: 10.2 mg/dL (ref 8.6–10.4)
Chloride: 102 mmol/L (ref 98–110)
Creat: 1.21 mg/dL — ABNORMAL HIGH (ref 0.60–0.95)
Glucose, Bld: 79 mg/dL (ref 65–99)
Potassium: 4.4 mmol/L (ref 3.5–5.3)
Sodium: 137 mmol/L (ref 135–146)
eGFR: 45 mL/min/1.73m2 — ABNORMAL LOW (ref >=60)

## 2024-09-28 LAB — CBC
HCT: 38.5 % (ref 35.0–45.0)
Hemoglobin: 12.7 g/dL (ref 11.7–15.5)
MCH: 33.2 pg — ABNORMAL HIGH (ref 27.0–33.0)
MCHC: 33 g/dL (ref 32.0–36.0)
MCV: 100.8 fL — ABNORMAL HIGH (ref 80.0–100.0)
MPV: 9.3 fL (ref 7.5–12.5)
Platelets: 211 Thousand/uL (ref 140–400)
RBC: 3.82 Million/uL (ref 3.80–5.10)
RDW: 12 % (ref 11.0–15.0)
WBC: 5 Thousand/uL (ref 3.8–10.8)

## 2024-10-01 ENCOUNTER — Other Ambulatory Visit: Payer: Self-pay

## 2024-10-01 ENCOUNTER — Ambulatory Visit: Payer: Self-pay | Admitting: Infectious Diseases

## 2024-10-01 DIAGNOSIS — A439 Nocardiosis, unspecified: Secondary | ICD-10-CM

## 2024-10-01 DIAGNOSIS — Z5181 Encounter for therapeutic drug level monitoring: Secondary | ICD-10-CM

## 2024-10-01 MED ORDER — SULFAMETHOXAZOLE-TRIMETHOPRIM 800-160 MG PO TABS: 1.0000 | ORAL_TABLET | Freq: Three times a day (TID) | ORAL | Status: DC

## 2024-10-03 ENCOUNTER — Other Ambulatory Visit: Payer: Self-pay

## 2024-10-03 ENCOUNTER — Other Ambulatory Visit

## 2024-10-03 DIAGNOSIS — Z5181 Encounter for therapeutic drug level monitoring: Secondary | ICD-10-CM

## 2024-10-03 LAB — BASIC METABOLIC PANEL WITH GFR
BUN/Creatinine Ratio: 13 (calc) (ref 6–22)
BUN: 16 mg/dL (ref 7–25)
CO2: 27 mmol/L (ref 20–32)
Calcium: 9.9 mg/dL (ref 8.6–10.4)
Chloride: 104 mmol/L (ref 98–110)
Creat: 1.22 mg/dL — ABNORMAL HIGH (ref 0.60–0.95)
Glucose, Bld: 103 mg/dL — ABNORMAL HIGH (ref 65–99)
Potassium: 4.5 mmol/L (ref 3.5–5.3)
Sodium: 139 mmol/L (ref 135–146)
eGFR: 45 mL/min/1.73m2 — ABNORMAL LOW (ref >=60)

## 2024-10-07 ENCOUNTER — Ambulatory Visit: Payer: Self-pay | Admitting: Infectious Diseases

## 2024-10-07 DIAGNOSIS — E119 Type 2 diabetes mellitus without complications: Secondary | ICD-10-CM | POA: Diagnosis not present

## 2024-10-07 DIAGNOSIS — I10 Essential (primary) hypertension: Secondary | ICD-10-CM | POA: Diagnosis not present

## 2024-10-07 DIAGNOSIS — E1169 Type 2 diabetes mellitus with other specified complication: Secondary | ICD-10-CM | POA: Diagnosis not present

## 2024-10-07 DIAGNOSIS — I340 Nonrheumatic mitral (valve) insufficiency: Secondary | ICD-10-CM | POA: Diagnosis not present

## 2024-10-12 ENCOUNTER — Telehealth: Admitting: Internal Medicine

## 2024-10-12 ENCOUNTER — Other Ambulatory Visit: Payer: Self-pay

## 2024-10-12 DIAGNOSIS — A439 Nocardiosis, unspecified: Secondary | ICD-10-CM

## 2024-10-12 DIAGNOSIS — R911 Solitary pulmonary nodule: Secondary | ICD-10-CM

## 2024-10-12 DIAGNOSIS — Z5181 Encounter for therapeutic drug level monitoring: Secondary | ICD-10-CM

## 2024-10-12 NOTE — Progress Notes (Signed)
 Virtual Visit via Video Note  I connected with Lisa Bradshaw on 10/12/24 at 10:45 AM EST by a video enabled telemedicine application and verified that I am speaking with the correct person using two identifiers.  Location: Patient: at home Provider: in clinic   I discussed the limitations of evaluation and management by telemedicine and the availability of in person appointments. The patient expressed understanding and agreed to proceed.  History of Present Illness:  [name] is a 78 yo F who is being treated for pulmonary nocardia infection, presumed based on path but tissue not sent to culture. She also follows with pulmonary, evaluating spiculated lung nodule. She sees sarah groce from pulmonary on Monday to review findings if we need repeat bronch Continue on bactrim , needs bmp Sees nephrology Now  on lisinopril 10mg  ( for sBP 159) -- for now her BP to be normal Gain weight - pedal edema  Observations/Objective: A x o by 3 in NAD  No respiratory distress  Assessment and Plan:  Consider Immunoglobulin testing, and sending path to [facility] for 16 S sequencing Continue with once a week labs to check on cr to titrate bactrim . Try to finish 6 month dosing  Return to clinic in 4 wk with manandhar  Follow Up Instructions:    I discussed the assessment and treatment plan with the patient. The patient was provided an opportunity to ask questions and all were answered. The patient agreed with the plan and demonstrated an understanding of the instructions.   The patient was advised to call back or seek an in-person evaluation if the symptoms worsen or if the condition fails to improve as anticipated.  I personally spent a total of 10 minutes in the care of the patient today including preparing to see the patient, documenting clinical information in the EHR, independently interpreting results, and communicating results.   Montie Bologna, MD

## 2024-10-15 ENCOUNTER — Ambulatory Visit: Admitting: Acute Care

## 2024-10-15 ENCOUNTER — Encounter: Payer: Self-pay | Admitting: Acute Care

## 2024-10-15 VITALS — BP 118/70 | HR 65 | Temp 98.3°F | Ht 67.0 in | Wt 156.0 lb

## 2024-10-15 DIAGNOSIS — R600 Localized edema: Secondary | ICD-10-CM

## 2024-10-15 DIAGNOSIS — I10 Essential (primary) hypertension: Secondary | ICD-10-CM | POA: Diagnosis not present

## 2024-10-15 DIAGNOSIS — A439 Nocardiosis, unspecified: Secondary | ICD-10-CM

## 2024-10-15 DIAGNOSIS — R918 Other nonspecific abnormal finding of lung field: Secondary | ICD-10-CM

## 2024-10-15 DIAGNOSIS — I429 Cardiomyopathy, unspecified: Secondary | ICD-10-CM | POA: Diagnosis not present

## 2024-10-15 DIAGNOSIS — R9389 Abnormal findings on diagnostic imaging of other specified body structures: Secondary | ICD-10-CM | POA: Diagnosis not present

## 2024-10-15 DIAGNOSIS — R911 Solitary pulmonary nodule: Secondary | ICD-10-CM

## 2024-10-15 NOTE — Progress Notes (Signed)
 History of Present Illness Lisa Bradshaw is a 78 y.o. female never smoker referred for lung nodule consult after incidental finding of a 1.7 cm spiculated pulmonary nodule noted on a cardiac PET scan. She will be followed by Dr. Shelah.  PMH includes Aortic stenosis, Pericardial effusion, DM, HLD, HTN, Breast ca s/p mastectomy.   Synopsis. Pt. Referred for abnormal finding of a 1.7 cm spiculated pulmonary nodule noted on cardiac CT 6/11/20205.This was followed by Super D CT Chest 06/03/2024. This showed by a super D CT chest which showed Spiculated 10 x 17 x 13 mm right middle lobe pulmonary nodule, highly concerning for bronchogenic malignancy. Correlation with PET scan or biopsy was recommended. Pt. underwent navigational bronchoscopy with biopsies on 06/05/2024.  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  Patient was referred to infectious disease, and has subsequently been started on Bactrim  high doses.  Goal is to reach 6 months of treatment.  Patient has had a slight bump in her creatinine and a decrease in her GFR while on treatment.  In order to compensate for this she has been taken off of her diuretic, and has subsequently developed lower extremity edema and some truncal edema.  She has been referred to nephrology by her primary care physician.  There was also notation of an abnormal thyroid .US  thyroid  on the super D CT 05/27/2024.  It showed a heterogeneous, enlarged and multinodular thyroid  gland most consistent with multinodular goiter. FNA biopsy was recommended. This was done 06/25/2024, and biopsy was negative for malignancy, positive for Benign follicular nodule (Bethesda category II) .   Hope had been that after treatment with Bactrim  imaging would reveal a treatment response.  Patient is here today to review imaging to determine plan of care moving forward     10/15/2024 Discussed the  use of AI scribe software for clinical note transcription with the patient, who gave verbal consent to proceed.  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  We have reviewed the results of the CT chest done 09/28/2024.  This shows a stable 1.7 x 1 cm spiculated right middle lobe nodule previously biopsied.  There is no new or progressive thoracic lymphadenopathy or any other suspicious lesions.  There is a partially resolved right middle lobe fluid-filled cavity with interval shrinking to an 8 mm nodule, stable bilateral 6 mm ground glass nodules, and a stable 6 mm solid right upper lobe nodule without interval growth.  Recommendation is for CT chest in 6 to 12 months to confirm persistence. There was also notation of some mild cardiomegaly with an unchanged small chronic pericardial effusion and coronary and aortic atherosclerosis.   She is very disheartened by the fact that we have had not seen a significant decrease in size of the nodule after treatment with Bactrim .  Infectious disease did decrease her dose of Bactrim  from 4 tablets daily to 3 tablets daily which she states has improved her nausea significantly.  They are also hopeful this will help to improve renal function.  She states she has been drinking lots of water at their encouragement.  She was seen by  infectious disease 10/12/2024 and I believe they are questioning whether or not we need to rebiopsy the nodule, in light of an negative therapeutic response to the Bactrim  treatment.  Patient is unsure what the plan will be moving forward.  She and I discussed a repeat bronchoscopy with biopsy to get second confirmation of the diagnosis.  She felt this was rather aggressive and  is not sure that she wants to do that.  I believe this may be what infectious disease would like us  to do.  The other option is to stay the infectious disease course complete 6 months of treatment with Bactrim , with continued follow-up surveillance imaging.  She is experiencing fluid retention, particularly at night, with swelling in her feet .  She has gained ten pounds over the course of her treatment and is unsure why.  We discussed that it might be related to the fact that she  is not exercising as much as before.  She used to walk daily and she has been able unable to do this.  Her diuretic was discontinued, to protect her renal function, which she associates with increased fluid retention.  Again she has been referred to nephrology to monitor her kidney function closely.  I have also referred her back to her primary care doctor to help determine what her blood pressure medication should look like at this point.  Her blood pressure has been elevated, ranging from 150 to 160, and she is taking lisinopril, which was initially reduced to 5 mg and then increased back to 10 mg, Her blood pressure today is 118/70.  She reports a recent onset of a tickle-type cough for about a week, which is non-productive. No fever or weight loss, although she notes weight gain, which she attributes to fluid retention.  She denies any fever, discolored secretions, and she states her cough is a bit better than it was in the past.  She is scheduled to see an endocrinologist for follow-up of her benign thyroid  nodule.  Again Ms. Sheahan takes excellent care of her health and is an excellent historian     Test Results: CT Chest 09/28/2024 Stable 1.7 x 1 cm spiculated right middle lobe nodule adjacent to fiducials, unchanged from prior studies, with no new or progressive thoracic lymphadenopathy or other suspicious lesions. 2. Partially resolved prior right middle lobe fluid-filled cavity with interval shrinking to an 8 mm  nodule with pleural stranding, likely postprocedural change; attention on follow-up recommended. . 3. Stable bilateral 6 mm ground-glass nodules and a stable 6 mm subsolid right upper lobe nodule without interval growth; recommend CT at 612 months to confirm persistence, then periodic surveillance per subsolid nodule guidelines. 4. Mild cardiomegaly with unchanged small chronic pericardial effusion and coronary and aortic atherosclerosis. 5. Chronic findings discussed above.  Cytology 06/05/2024 FINAL MICROSCOPIC DIAGNOSIS:  A. LUNG, RML, FINE NEEDLE ASPIRATION:  - Negative for malignancy.  - Filamentous bacteria, see comment.   B. LUNG, RML, LAVAGE:    FINAL MICROSCOPIC DIAGNOSIS:  - No malignant cells identified  - Benign bronchial cells   Comment:  AFB stain highlights filamentous bacteria. 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   Cardiac PET Scan 04/2024 1.7 cm spiculated nodule in the right middle lobe, suspicious for bronchogenic carcinoma. Complete FDG PET-CT is recommended for further evaluation.    Latest Ref Rng & Units 09/27/2024    2:42 PM 08/02/2024    9:13  AM 07/03/2024    3:49 PM  CBC  WBC 3.8 - 10.8 Thousand/uL 5.0  4.1  9.7   Hemoglobin 11.7 - 15.5 g/dL 87.2  86.7  85.8   Hematocrit 35.0 - 45.0 % 38.5  40.6  42.7   Platelets 140 - 400 Thousand/uL 211  206  218        Latest Ref Rng & Units 10/03/2024    8:54 AM 09/27/2024    2:42 PM 08/02/2024    9:13 AM  BMP  Glucose 65 - 99 mg/dL 896  79  898   BUN 7 - 25 mg/dL 16  16  16    Creatinine 0.60 - 0.95 mg/dL 8.77  8.78  8.95   BUN/Creat Ratio 6 - 22 (calc) 13  13  15    Sodium 135 - 146 mmol/L 139  137  138   Potassium 3.5 - 5.3 mmol/L 4.5  4.4  4.4   Chloride 98 - 110 mmol/L 104  102  102   CO2 20 - 32 mmol/L 27  28  29    Calcium 8.6 - 10.4 mg/dL 9.9  89.7  89.9     BNP No results found for:  BNP  ProBNP No results found for: PROBNP  PFT No results found for: FEV1PRE, FEV1POST, FVCPRE, FVCPOST, TLC, DLCOUNC, PREFEV1FVCRT, PSTFEV1FVCRT  CT CHEST WO CONTRAST Result Date: 10/04/2024 EXAM: CT CHEST WITHOUT CONTRAST 09/28/2024 07:52:00 AM TECHNIQUE: CT of the chest was performed without the administration of intravenous contrast. Multiplanar reformatted images are provided for review. Automated exposure control, iterative reconstruction, and/or weight based adjustment of the mA/kV was utilized to reduce the radiation dose to as low as reasonably achievable. COMPARISON: Portable chest 06/05/2024, and chest CT with no contrast 07/03/2024 and 06/03/2024. CLINICAL HISTORY: Prior history of breast cancer with bilateral mastectomies. The patient underwent FNA for right lower pole heterogeneous thyroid  nodule 06/25/2024, and right middle lobe nodule on 06/05/2024, both of which yielded benign results. FINDINGS: MEDIASTINUM: Heart: Mild cardiomegaly. Calcifications and thickening of the aortic valve leaflets are again noted. Small chronic pericardial effusion. Scattered 2-vessel calcific plaques in the LAD and right coronary arteries. Vessels: Atherosclerosis in the aorta and great vessels without aneurysm. Pulmonary arteries and veins are normal caliber. Central airways: The central airways are clear. Thyroid : There is a stable substernal goiter off the lower right thyroid  with dystrophic calcifications and prior benign FNA. No interval change in appearance. LYMPH NODES: Scattered borderline-sized mediastinal lymph nodes, but no new or progressive adenopathy. No hilar or axillary lymphadenopathy. LUNGS AND PLEURA: Lungs: Mild diffuse bronchial thickening. Biapical pleuroparenchymal scarring. Scattered calcified granulomas are again seen. Nodules: There is a stable 1.7 x 1 cm spiculated nodule in the right middle lobe adjacent to 2 fiducials, as measured on images 69 and 70 of series 3.  Just above this, there previously was a 2.2 cm fluid-filled cavity in the right middle lobe, which was probably a hematoma or seroma. This has partially resolved with an 8 mm nodule now seen in the area with pleural stranding. The last study demonstrated a new 3 mm right lower lobe nodule anteriorly which has resolved. There are stable ground glass nodules, both measuring 6 mm, 1 in the anterior basal left lower lobe on axial 87 and the other in the right middle lobe base axial 107, and a stable 6 mm right upper lobe subsolid nodule again on axial 49. There are no new or further nodules. Pleura: No pleural effusion or pneumothorax. Consolidation: No focal  consolidation or pulmonary edema. SOFT TISSUES/BONES: Soft tissues: Bilateral mastectomy changes. No chest wall mass is seen. Bones: Osteopenia with degenerative changes and dextroscoliosis of the thoracic spine. 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 No regional destructive bone lesions. UPPER ABDOMEN: Kidneys: There is a 4 mm nonobstructive calyceal stone in the mid pole left kidney and a 5 cm Bosniak 1 cyst in the left upper renal pole. No acute findings. Other: Limited images of the upper abdomen demonstrate no acute abnormality. IMPRESSION: 1. Stable 1.7 x 1 cm spiculated right middle lobe nodule adjacent to fiducials, unchanged from prior studies, with no new or progressive thoracic lymphadenopathy or other suspicious lesions. 2. Partially resolved prior right middle lobe fluid-filled cavity with interval shrinking to an 8 mm nodule with pleural stranding, likely postprocedural change; attention on follow-up recommended. . 3. Stable bilateral 6 mm ground-glass nodules and a stable 6 mm subsolid right upper lobe nodule without interval growth; recommend CT at 612 months to confirm persistence, then periodic surveillance per subsolid nodule guidelines. 4. Mild cardiomegaly with unchanged small chronic pericardial effusion and coronary  and aortic atherosclerosis. 5. Chronic findings discussed above. 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 > 20 yr.   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 breast [facility])    Mass of finger of right hand 02/06/2014   thumb   Mild concentric left ventricular hypertrophy    Mitral valve regurgitation    Non-insulin dependent type 2 diabetes mellitus (HCC)    Osteoarthritis of hand    Pericardial effusion (noninflammatory) 11/08/2000   history of  - no longer sees cardiologist   Seasonal allergies      Social History   Tobacco Use   Smoking status: Never    Passive exposure: Past   Smokeless tobacco: Never  Substance Use Topics   Alcohol use: No   Drug use: No    Ms.Dressel reports that she has never smoked. She has been exposed to tobacco smoke. She has never used smokeless tobacco. She reports that she does not drink alcohol and does not use drugs.  Tobacco Cessation: Counseling given: Not Answered Never smoker  Past surgical hx, Family hx, Social hx all reviewed.  Current Outpatient Medications on File Prior to Visit  Medication Sig   aspirin 81 MG tablet Take 81 mg by mouth daily.   ezetimibe-simvastatin (VYTORIN) 10-40 MG per tablet Take 1 tablet by mouth daily.   loratadine (CLARITIN) 10 MG tablet Take 10 mg by mouth daily as needed for allergies.    Multiple Vitamin  (MULTIVITAMIN) tablet Take 1 tablet by mouth daily.   ONETOUCH VERIO test strip 1 each 2 (two) times daily.   sulfamethoxazole -trimethoprim  (BACTRIM  DS) 800-160 MG tablet Take  1 tablet by mouth in the morning, at noon, and at bedtime.   No current facility-administered medications on file prior to visit.     No Known Allergies  Review Of Systems:  Constitutional:   No  weight loss, night sweats,  Fevers, chills, +fatigue, or  lassitude.  HEENT:   No headaches,  Difficulty swallowing,  Tooth/dental problems, or  Sore throat,                No sneezing, itching, ear ache, nasal congestion, post nasal drip,   CV:  No chest pain,  Orthopnea, PND, +swelling in lower extremities,+ truncal edema per patient, no anasarca, dizziness, palpitations, syncope.   GI  No heartburn, indigestion, abdominal pain, nausea, vomiting, diarrhea, change in bowel habits, loss of appetite, bloody stools.  Patient notes a significant improvement in her nausea as the Bactrim  has been decreased from 4 tablets daily to 3 tablets daily.  Resp: + shortness of breath with exertion less at rest.  No excess mucus, no productive cough,  + non-productive cough,  No coughing up of blood.  No change in color of mucus.  No wheezing.  No chest wall deformity  Skin: no rash or lesions.  GU: no dysuria, change in color of urine, no urgency or frequency.  No flank pain, no hematuria   MS:  No joint pain or swelling.  No decreased range of motion.  No back pain.  Psych:  No change in mood or affect. No depression or anxiety.  No memory loss.   Vital Signs BP 118/70   Pulse 65   Temp 98.3 F (36.8 C) (Oral)   Ht 5' 7 (1.702 m)   Wt 156 lb (70.8 kg)   SpO2 99%   BMI 24.43 kg/m    Physical Exam:  Physical Exam VITALS: BP- 118/70 GENERAL: No distress, alert and oriented times 3. EARS NOSE THROAT: No sinus tenderness, tympanic membranes clear, pale nasal mucosa, no oral exudate, no post nasal drip, no  lymphadenopathy. CHEST: Lungs clear to auscultation bilaterally. No wheeze, rales, dullness, no accessory muscle use, no nasal flaring, no sternal retractions. CARDIAC: S1, S2, regular rate and rhythm, no murmur. ABDOMINAL: Soft, non tender. ND, BS present., Body mass index is 24.43 kg/m.  EXTREMITIES: Swelling noted at the rim of the sock on the left leg. No clubbing, cyanosis, edema. No obvious deformities. NEUROLOGICAL: Normal strength. Alert and oriented x 3, MAE x 4. SKIN: No rashes, warm and dry. No obvious skin lesions. PSYCHIATRIC: Normal mood and behavior.   Assessment/Plan Assessment & Plan Pulmonary nodules/Nocardia per biopsy Currently being treated by infectious disease with Bactrim  3 tablets daily. Patient has been treated with Bactrim  since August 2025 No significant change in size on surveillance imaging done for evaluation of treatment response, which is frustrating for the patient.   New ground glass nodules increased from 5 mm to 6 mm.   No malignancy noted on previous biopsy.  - Follow-up CT in three months. - Follow-up with Meet Weathington or Dr. Shelah in 3 months to review the results of the scan. - Discuss with Dr. Shelah regarding nodule stability and potential intervention. - Monitor for new symptoms: weight loss, hemoptysis, cough changes.   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 Increase in bilateral lower extremity edema and truncal edema since diuretic has been discontinued Plan Please talk with  primary care or cardiology regarding possible every other day dosing of diuretic. Continued monitoring of renal function Patient has been referred to nephrology for evaluation  Hypertension Per patient blood pressure has been elevated, and blood pressure medication has been adjusted. Today in the office  blood pressure is 118/70 diastolic Patient has follow-up with cardiology and PCP to further evaluate need for any medication adjustments.  I will reach out to Dr. Shelah and see what he thinks regarding need for repeat bronchoscopy with biopsy.  Patient does not have a productive cough that would allow sputum culture.  I spent 45 minutes dedicated to the care of this patient on the date of this encounter to include pre-visit review of records, face-to-face time with the patient discussing conditions above, post visit ordering of testing, clinical documentation with the electronic health record, making appropriate referrals as documented, and communicating necessary information to the patient's healthcare team.   Lauraine JULIANNA Lites, NP 10/15/2024  8:42 AM

## 2024-10-15 NOTE — Patient Instructions (Addendum)
 It is good to see you today. We have reviewed your Ct Chest.  The nodule suspicious for Nocardia remains stable in size.  We will do a 3 month follow up CT chest which will be due the end of February 2026.  Follow up with Infectious Disease as is scheduled.  Follow up with Endocrinology as is scheduled. Follow up with the renal physician as is scheduled.  I will talk with Dr. Shelah about the fact that the nodule suspicious for Nocardia has not decreased in size.  I will let you know if he has any different suggestions.  Follow up in 3 months after the scan has been done to review results. Please contact office for sooner follow up if symptoms do not improve or worsen or seek emergency care

## 2024-10-18 ENCOUNTER — Telehealth: Payer: Self-pay

## 2024-10-18 ENCOUNTER — Other Ambulatory Visit: Payer: Self-pay | Admitting: Acute Care

## 2024-10-18 ENCOUNTER — Telehealth: Payer: Self-pay | Admitting: Acute Care

## 2024-10-18 DIAGNOSIS — R0609 Other forms of dyspnea: Secondary | ICD-10-CM

## 2024-10-18 NOTE — Telephone Encounter (Signed)
 Attempted to call to schedule pft will send mychart message

## 2024-10-18 NOTE — Telephone Encounter (Signed)
 I have called the patient to let her know that I spoke with Dr., Shelah. He is in agreement with a 3 month follow up scan. He does not feel she needs to have repeat biopsy. We will image and make sure she does not have continued growth.She verbalized understanding . While on the phone she stated she has noticed she has more exertional dyspnea. She has not been checking her oxygen sats, but does have a monitor. I have asked her to check her sats when she is short of breath. Bactrim  Is associated with pulmonary toxicity. ID has recently decreased her dose. She has had a recent CT chest on 09/28/2024 which showed a stable 1.7 x 1 cm spiculated nodule in the right middle lobe. She does have some stable GG nodules, but no obvious signs of toxicity . She had her serum potassium checked 11/26. Her K was 4.5.  I have asked her to follow up with ID. Dewayne, or Software Engineer, can we get patient scheduled for PFT's and then follow up with me after. Dr. Cleaster [name]/  , I wanted to make sure you were aware of the shortness of breath. Our plan is for 3 month follow up imaging. I was not sure of plan for  Bactrim  treatment Please let us  know if you have any questions . Thanks so much  Spend 7 minutes on the phone with patient

## 2024-10-18 NOTE — Progress Notes (Signed)
 See telephone note dated 10/18/2024

## 2024-10-19 ENCOUNTER — Ambulatory Visit: Admitting: "Endocrinology

## 2024-10-19 ENCOUNTER — Encounter: Payer: Self-pay | Admitting: "Endocrinology

## 2024-10-19 VITALS — BP 140/80 | HR 85 | Ht 67.0 in | Wt 153.0 lb

## 2024-10-19 DIAGNOSIS — E042 Nontoxic multinodular goiter: Secondary | ICD-10-CM

## 2024-10-19 NOTE — Progress Notes (Signed)
 Outpatient Endocrinology Note Lisa Birmingham, MD  10/19/2024   Lisa Bradshaw [birth year] [id number]  Referring Provider: Lucien Orren LOISE DEVONNA Primary Care Provider: Claudene Pellet, MD Subjective  No chief complaint on file.   Assessment & Plan  Diagnoses and all orders for this visit:  Multinodular goiter -     TSH -     T4, free    Lisa Bradshaw has never taking any thyroid  nodule. Patient is currently biochemically euthyroid.  Educated on thyroid  axis.  Recommend the following: labs annually. Repeat lab before next visit or sooner if symptoms of hyperthyroidism or hypothyroidism develop.  Notify us  immediately in case of significant weight gain or loss. Counseled on compliance and follow up needs.  06/13/24: US  thyroid  images and report reviewed: reported heterogeneous, enlarged and multinodular thyroid  gland most consistent with multinodular goiter. Nodule # 3 is a 3.3 cm TI-RADS category 3 nodule in the right lower gland and meets criteria to consider fine-needle aspiration biopsy. No other nodules meet criteria for biopsy or imaging surveillance. 06/25/24: Nodule #3: Isoechoic solid nodule in the right lower gland measures 3.3 x 2.5 x 3.1 cm. TI-RADS category 3. Benign follicular nodule (Bethesda category II)  Follow up thyroid  U./S in 06/2025  I have reviewed current medications, nurse's notes, allergies, vital signs, past medical and surgical history, family medical history, and social history for this encounter. Counseled patient on symptoms, examination findings, lab findings, imaging results, treatment decisions and monitoring and prognosis. The patient understood the recommendations and agrees with the treatment plan. All questions regarding treatment plan were fully answered.   Return in about 8 months (around 06/19/2025) for visit + labs before next visit.   Lisa Birmingham, MD  10/19/2024   I have reviewed current medications, nurse's notes, allergies,  vital signs, past medical and surgical history, family medical history, and social history for this encounter. Counseled patient on symptoms, examination findings, lab findings, imaging results, treatment decisions and monitoring and prognosis. The patient understood the recommendations and agrees with the treatment plan. All questions regarding treatment plan were fully answered.   History of Present Illness Lisa Bradshaw is a 78 y.o. year old female who presents to our clinic with multinodular gotier diagnosed in 2025.    Never been on thyroid  medication  Symptoms suggestive of HYPOTHYROIDISM:  fatigue Yes weight gain Yes+-few lbs cold intolerance  Yes,  hands and feet constipation  Yes  Symptoms suggestive of HYPERTHYROIDISM:  weight loss  No heat intolerance No hyperdefecation  No palpitations  No, + skip beats  Compressive symptoms:  dysphagia  No dysphonia  No positional dyspnea (especially with simultaneous arms elevation)  No  Smokes  No On biotin  No Personal history of head/neck surgery/irradiation  No   Physical Exam  BP (!) 140/80   Pulse 85   Ht 5' 7 (1.702 m)   Wt 153 lb (69.4 kg)   SpO2 97%   BMI 23.96 kg/m  Constitutional: well developed, well nourished Head: normocephalic, atraumatic, no exophthalmos Eyes: sclera anicteric, no redness Neck: + thyromegaly, no thyroid  tenderness; + nodules palpated Lungs: normal respiratory effort Neurology: alert and oriented, no fine hand tremor Skin: dry, no appreciable rashes Musculoskeletal: no appreciable defects Psychiatric: normal mood and affect  Allergies Allergies[1]  Current Medications Patient's Medications  New Prescriptions   No medications on file  Previous Medications   ASPIRIN 81 MG TABLET    Take 81 mg by mouth daily.   EZETIMIBE-SIMVASTATIN (VYTORIN)  10-40 MG PER TABLET    Take 1 tablet by mouth daily.   LISINOPRIL (ZESTRIL) 5 MG TABLET    Take 5 mg by mouth daily.   LORATADINE  (CLARITIN) 10 MG TABLET    Take 10 mg by mouth daily as needed for allergies.    MULTIPLE VITAMIN (MULTIVITAMIN) TABLET    Take 1 tablet by mouth daily.   ONETOUCH VERIO TEST STRIP    1 each 2 (two) times daily.   SULFAMETHOXAZOLE -TRIMETHOPRIM  (BACTRIM  DS) 800-160 MG TABLET    Take 1 tablet by mouth in the morning, at noon, and at bedtime.  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 > 20 yr.   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 breast [facility])    Mass of finger of right hand 02/06/2014   thumb   Mild concentric left ventricular hypertrophy    Mitral valve regurgitation    Non-insulin dependent type 2 diabetes mellitus (HCC)    Osteoarthritis of hand    Pericardial effusion (noninflammatory) 11/08/2000   history of  - no longer sees cardiologist   Seasonal allergies     Past Surgical History Past Surgical History:  Procedure Laterality Date   ABDOMINAL HYSTERECTOMY  1992   complete   CESAREAN SECTION  1969   EXCISION METACARPAL MASS Right 02/25/2014   Procedure: EXCISION MASS RIGHT THUMB  ;  Surgeon: Franky JONELLE Curia, MD;  Location: [facility];  Service: Orthopedics;  Laterality: Right;   FOOT NEUROMA SURGERY Left 2006   MASTECTOMY Left 04/03/2004   TOTAL MASTECTOMY Right 09/22/2006   with ax. node bx.   TRANSESOPHAGEAL ECHOCARDIOGRAM  08/23/2001   VIDEO  BRONCHOSCOPY WITH ENDOBRONCHIAL NAVIGATION Right 06/05/2024   Procedure: VIDEO BRONCHOSCOPY WITH ENDOBRONCHIAL NAVIGATION;  Surgeon: Shelah Lamar RAMAN, MD;  Location: [facility] ENDOSCOPY;  Service: Pulmonary;  Laterality: Right;    Family History family history includes CAD in her father; Cancer - Other in her father and mother; Stroke in her father.  Social History Social History   Socioeconomic History   Marital status: Widowed    Spouse name: Not on file   Number of children: Not on file   Years of education: Not on file   Highest education level: Not on file  Occupational History   Not on file  Tobacco Use   Smoking status: Never    Passive exposure: Past   Smokeless tobacco: Never  Substance and Sexual Activity   Alcohol use: No   Drug use: No   Sexual activity: Not on file  Other Topics Concern   Not on file  Social History Narrative   Not on file   Social Drivers of Health   Tobacco Use: Low  Risk (10/19/2024)   Patient History    Smoking Tobacco Use: Never    Smokeless Tobacco Use: Never    Passive Exposure: Past  Financial Resource Strain: Not on file  Food Insecurity: Low Risk (08/22/2024)   Received from [facility]   Epic    Within the past 12 months, you worried that your food would run out before you got money to buy more: Never true    Within the past 12 months, the food you bought just didn't last and you didn't have money to get more. : Never true  Transportation Needs: No Transportation Needs (08/22/2024)   Received from [facility]    In the past 12 months, has lack of reliable transportation kept you from medical appointments, meetings, work or from getting things needed for daily living? : No  Physical Activity: Not on file  Stress: Not on file  Social Connections: Not on file  Intimate Partner Violence: Not on file  Depression (EYV7-0): Not on file  Alcohol Screen: Not on file  Housing: Low Risk (08/22/2024)   Received from [facility]   Epic    What is your living situation today?: I have a steady place to live    Think about the place you live. Do you have problems with any of the following? Choose all that apply:: None/None on this list  Utilities: Low Risk (08/22/2024)   Received from [facility]   Utilities    In the past 12 months has the electric, gas, oil, or water company threatened to shut off services in your home? : No  Health Literacy: Not on file    Laboratory Investigations Lab Results  Component Value Date   TSH 1.430 07/03/2024     No results found for: TSI   No components found for: TRAB   No results found for: CHOL No results found for: HDL No results found for: LDLCALC No results found for: TRIG No results found for: [facility] Lab Results  Component Value Date   CREATININE 1.22 (H) 10/03/2024   No results found for: GFR    Component Value Date/Time   NA 139 10/03/2024 0854   NA 138 07/03/2024 1549   K 4.5 10/03/2024 0854   CL 104 10/03/2024 0854   CO2 27 10/03/2024 0854   GLUCOSE 103 (H) 10/03/2024 0854   BUN 16 10/03/2024 0854   BUN 23 07/03/2024 1549   CREATININE 1.22 (H) 10/03/2024 0854   CALCIUM 9.9 10/03/2024 0854   GFRNONAA >60 06/05/2024 0548   GFRAA >60 06/30/2015 1250      Latest Ref Rng & Units 10/03/2024    8:54 AM 09/27/2024    2:42 PM 08/02/2024    9:13 AM  BMP  Glucose 65 - 99 mg/dL 896  79  898   BUN 7 - 25 mg/dL 16  16  16    Creatinine 0.60 - 0.95 mg/dL 8.77  8.78  8.95   BUN/Creat Ratio 6 - 22 (calc) 13  13  15    Sodium 135 - 146 mmol/L 139  137  138   Potassium 3.5 - 5.3 mmol/L 4.5  4.4  4.4   Chloride 98 - 110 mmol/L 104  102  102   CO2 20 - 32 mmol/L 27  28  29    Calcium 8.6 - 10.4 mg/dL 9.9  89.7  89.9        Component Value Date/Time   WBC 5.0 09/27/2024 1442   RBC 3.82  09/27/2024 1442   HGB 12.7 09/27/2024 1442   HGB 14.1 07/03/2024 1549   HCT 38.5 09/27/2024 1442   HCT 42.7 07/03/2024 1549   PLT 211 09/27/2024 1442    PLT 218 07/03/2024 1549   MCV 100.8 (H) 09/27/2024 1442   MCV 96 07/03/2024 1549   MCH 33.2 (H) 09/27/2024 1442   MCHC 33.0 09/27/2024 1442   RDW 12.0 09/27/2024 1442   RDW 12.6 07/03/2024 1549      Parts of this note may have been dictated using voice recognition software. There may be variances in spelling and vocabulary which are unintentional. Not all errors are proofread. Please notify the dino if any discrepancies are noted or if the meaning of any statement is not clear.       [1] No Known Allergies

## 2024-10-23 ENCOUNTER — Other Ambulatory Visit: Payer: Self-pay | Admitting: *Deleted

## 2024-10-23 DIAGNOSIS — R059 Cough, unspecified: Secondary | ICD-10-CM

## 2024-10-23 DIAGNOSIS — R0609 Other forms of dyspnea: Secondary | ICD-10-CM

## 2024-10-25 ENCOUNTER — Ambulatory Visit

## 2024-10-25 DIAGNOSIS — R0609 Other forms of dyspnea: Secondary | ICD-10-CM

## 2024-10-25 DIAGNOSIS — R059 Cough, unspecified: Secondary | ICD-10-CM

## 2024-10-25 LAB — PULMONARY FUNCTION TEST
DL/VA % pred: 136 %
DL/VA: 5.46 ml/min/mmHg/L
DLCO cor % pred: 103 %
DLCO cor: 21.38 ml/min/mmHg
DLCO unc % pred: 103 %
DLCO unc: 21.38 ml/min/mmHg
FEF 25-75 Post: 1.74 L/s
FEF 25-75 Pre: 0.94 L/s
FEF2575-%Change-Post: 85 %
FEF2575-%Pred-Post: 110 %
FEF2575-%Pred-Pre: 59 %
FEV1-%Change-Post: 19 %
FEV1-%Pred-Post: 75 %
FEV1-%Pred-Pre: 63 %
FEV1-Post: 1.67 L
FEV1-Pre: 1.39 L
FEV1FVC-%Change-Post: 7 %
FEV1FVC-%Pred-Pre: 94 %
FEV6-%Change-Post: 12 %
FEV6-%Pred-Post: 79 %
FEV6-%Pred-Pre: 70 %
FEV6-Post: 2.24 L
FEV6-Pre: 1.98 L
FEV6FVC-%Change-Post: 1 %
FEV6FVC-%Pred-Post: 105 %
FEV6FVC-%Pred-Pre: 104 %
FVC-%Change-Post: 11 %
FVC-%Pred-Post: 75 %
FVC-%Pred-Pre: 67 %
FVC-Post: 2.24 L
FVC-Pre: 2 L
Post FEV1/FVC ratio: 75 %
Post FEV6/FVC ratio: 100 %
Pre FEV1/FVC ratio: 70 %
Pre FEV6/FVC Ratio: 99 %
RV % pred: 153 %
RV: 3.89 L
TLC % pred: 110 %
TLC: 6.04 L

## 2024-10-25 NOTE — Patient Instructions (Signed)
 Full PFT performed today.

## 2024-10-25 NOTE — Progress Notes (Signed)
 Full PFT performed today.

## 2024-10-30 NOTE — Telephone Encounter (Signed)
 Copied from CRM [id number]. Topic: Clinical - Lab/Test Results >> Oct 29, 2024  2:53 PM Essie A wrote: Reason for CRM: Patient would like a call with the results of her PFT test.  Please return her call at [phone].  Thanks.

## 2024-11-05 ENCOUNTER — Encounter: Payer: Self-pay | Admitting: Infectious Diseases

## 2024-11-05 ENCOUNTER — Ambulatory Visit: Admitting: Infectious Diseases

## 2024-11-05 ENCOUNTER — Other Ambulatory Visit: Payer: Self-pay

## 2024-11-05 VITALS — BP 179/77 | HR 80 | Temp 98.7°F | Resp 16 | Wt 155.2 lb

## 2024-11-05 DIAGNOSIS — N179 Acute kidney failure, unspecified: Secondary | ICD-10-CM | POA: Diagnosis not present

## 2024-11-05 DIAGNOSIS — A439 Nocardiosis, unspecified: Secondary | ICD-10-CM | POA: Diagnosis not present

## 2024-11-05 DIAGNOSIS — E119 Type 2 diabetes mellitus without complications: Secondary | ICD-10-CM | POA: Diagnosis not present

## 2024-11-05 DIAGNOSIS — R911 Solitary pulmonary nodule: Secondary | ICD-10-CM

## 2024-11-05 DIAGNOSIS — Z5181 Encounter for therapeutic drug level monitoring: Secondary | ICD-10-CM

## 2024-11-05 DIAGNOSIS — I10 Essential (primary) hypertension: Secondary | ICD-10-CM

## 2024-11-05 MED ORDER — SULFAMETHOXAZOLE-TRIMETHOPRIM 800-160 MG PO TABS: 1.0000 | ORAL_TABLET | Freq: Three times a day (TID) | ORAL | 0 refills | Status: AC

## 2024-11-05 NOTE — Progress Notes (Unsigned)
 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     ezetimibe-simvastatin (VYTORIN) 10-40 MG per tablet Take 1 tablet by mouth daily.     lisinopril (ZESTRIL) 5 MG tablet Take 5 mg by mouth daily.     loratadine (CLARITIN) 10 MG tablet Take 10 mg by mouth daily as needed for allergies.      Multiple Vitamin (MULTIVITAMIN) tablet Take 1 tablet by mouth daily.     ONETOUCH VERIO test strip 1 each 2 (two) times daily.     No current facility-administered medications on file prior to visit.    Subjective: Discussed the use of AI scribe software for clinical note transcription with the patient, who gave verbal consent to proceed.   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   She initially had a NM PET scan by Dr Jeffrie for evaluation of chest pain. Findings showed 1.7 cm spiculated nodule in the right middle lobe, suspicious for bronchogenic carcinoma.   This was followed by CT Chest which showed  Spiculated 10 x 17 x 13 mm right middle lobe pulmonary nodule, highly concerning for bronchogenic malignancy.  Heterogeneous nodular enlargement of the thyroid  with exophytic 3.5 cm retrosternal nodule as above. Recommend non-emergent thyroid   ultrasound.  She was then referred to Pulmonary and underwent  Flexible video fiberoptic bronchoscopy and biopsies. Patient tells me some of the specimen got lost. Do not see any cultures were sent.   She denies any respiratory symptoms as such before the bronchoscopy like cough, SOB, fevers and chills. However, after the bronchoscopy, she started having cough and increased wheezing. She completed a course of Zpack and prednisone  taper with significant improvement.   She had a fu with Pulmonary on 8/4 where she was started on Bactrim  DS po bid for 21 days along with mucinex, flutter valve.   She also had an US  guided biopsy for the thyroid  nodule on 8/18. Path with benign follicular nodule.   Reports she is a pretty functional and highly active 78 year old woman and involved a lot of activities including volunteering. Denies any changes in appetite but reports weight down from 180 lbs to 147 lbs, with intentional dietary changes.   She experienced an episode of nausea and vomiting this Monday, accompanied by profuse sweating but no diarrhea/abdominal pain. No recent headaches, blurry vision, rashes, GU symptoms or neck pain, joint pain, although she has arthritis.   Denies smoking, alcohol, or recreational drug use. She lives alone and has two adult children. She denies being sexually active for many years.   Denies known h/o of any immunocompromising conditions or diseases or prolonged steroids.  9/25  Reports compliance with p.o. Bactrim  2 tablets twice daily.  However she feels sick and nauseous.  She is trying to keep herself hydrated as much as possible.  She also had low blood pressure as well as fluctuation in her blood glucose for which she is following PCP.  She is unable to expectorate any sputum.  She does not feel any different after taking antibiotics.   She has several questions regarding if she truly has nocardia pneumonia or something else.  If she has nocardia pneumonia, how  could she have guarded. [place], which she speculates could have exposed her to the infection.  Seen by pulmonary NP 9/17 and plan to repeat CT chest in 3 months and consider repeat bronchoscopy if cultures are negative.   She talked to her granddaughter who is a respiratory therapist and recommended to get an expert opinion at [facility].  11/20 She was seen by Dr. Norman on 10/15 at [facility] for second opinion who recommended to continue Bactrim  for probable 6 months.  Per their notes, patient wished to follow-up with them but today patient states, she wants to follow-up with us  as ?  did not get refill of meds and no changes in treatment plan was made.   She reports compliance with p.o. Bactrim  with no missed doses, however feels fatigued, nauseous.  She is drinking 60 to 70 ounce of water every day and feels like gaining weight, denies any vomiting, abdominal pain or diarrhea. No significant change in breathing but mild cough.  She reports BP running high and following with PCP/cardiology. Reports receiving flu vaccine.   12/29 Decreased Bactrim  to 1 tablet p.o. 3 times daily on 11/24 due to elevated creatinine.  Seen by Dr. Luiz on 12/5, continued on Bactrim  p.o. 3 times daily. No labs were done.  Seen by pulmonary on 12/8, CT chest findings were reviewed.  Discussed case with Dr. Shelah and plan to follow-up another CT chest in 3 months after approximately 6 months of Bactrim  treatment. PFT has been completed and results are pending.  She reports still nauseous but better than with 4 tabs daily dosing and new joint pains in her knee and hip with worsening mobility, including near falls and difficulty climbing stairs. She notes new confusion and memory problems, saying she feels she is not me anymore. She is worried that the pulmonary nodule has not decreased in size after about four months of therapy and questions whether the dose  is effective, and she is concerned that Bactrim  is harming her kidneys. She has taken lisinopril as needed for her BP.  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 > 20 yr.   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 breast [facility])    Mass of finger of right hand 02/06/2014   thumb  Mild concentric left ventricular hypertrophy    Mitral valve regurgitation    Non-insulin dependent type 2 diabetes mellitus (HCC)    Osteoarthritis of hand    Pericardial effusion (noninflammatory) 11/08/2000   history of  - no longer sees cardiologist   Seasonal allergies    Past Surgical History:  Procedure Laterality Date   ABDOMINAL HYSTERECTOMY  1992   complete   CESAREAN SECTION  1969   EXCISION METACARPAL MASS Right 02/25/2014   Procedure: EXCISION MASS RIGHT THUMB  ;  Surgeon: Franky JONELLE Curia, MD;  Location: [facility];  Service: Orthopedics;  Laterality: Right;   FOOT NEUROMA SURGERY Left 2006   MASTECTOMY Left 04/03/2004   TOTAL MASTECTOMY Right 09/22/2006   with ax. node bx.   TRANSESOPHAGEAL ECHOCARDIOGRAM  08/23/2001   VIDEO BRONCHOSCOPY WITH ENDOBRONCHIAL NAVIGATION Right 06/05/2024   Procedure: VIDEO BRONCHOSCOPY WITH ENDOBRONCHIAL NAVIGATION;  Surgeon: Shelah Lamar RAMAN, MD;  Location[facility];  Service: Pulmonary;  Laterality: Right;    Social History   Tobacco Use   Smoking status: Never     Passive exposure: Past   Smokeless tobacco: Never  Substance Use Topics   Alcohol use: No   Drug use: No    Family History  Problem Relation Age of Onset   Cancer - Other Mother    Cancer - Other Father    Stroke Father    CAD Father     No Known Allergies  Health Maintenance  Topic Date Due   COVID-19 Vaccine (1) Never done   Diabetic kidney evaluation - Urine ACR  Never done   Mammogram  05/05/2007   Bone Density Scan  Never done   Medicare Annual Wellness (AWV)  12/09/2023   Influenza Vaccine  02/05/2025 (Originally 06/08/2024)   Diabetic kidney evaluation - eGFR measurement  10/03/2025   DTaP/Tdap/Td (3 - Td or Tdap) 07/25/2030   Pneumococcal Vaccine: 50+ Years  Completed   Zoster Vaccines- Shingrix  Completed   Meningococcal B Vaccine  Aged Out   Colonoscopy  Discontinued    Objective: BP (!) 179/77   Pulse 80   Temp 98.7 F (37.1 C) (Oral)   Resp 16   Wt 155 lb 3.2 oz (70.4 kg)   SpO2 96%   BMI 24.31 kg/m   Physical Exam Constitutional:      Appearance: Normal appearance.  HENT:     Head: Normocephalic and atraumatic.      Mouth: Mucous membranes are moist.  Eyes:    Conjunctiva/sclera: Conjunctivae normal.     Pupils: Pupils are equal, round, and b/l symmetrical    Cardiovascular:     Rate and Rhythm: Normal rate     Heart sounds:  Pulmonary:     Effort: Pulmonary effort is normal.     Breath sounds:  Abdominal:     General: Non distended     Palpations:   Musculoskeletal:        General: Normal range of motion.   Skin:    General: Skin is warm and dry.     Comments:  Neurological:     General: grossly non focal     Mental Status: awake, alert and oriented to person, place, and time.   Psychiatric:        Mood and Affect: Mood normal.   Lab Results Lab Results  Component Value Date   WBC 5.0 09/27/2024   HGB 12.7 09/27/2024   HCT 38.5 09/27/2024   MCV 100.8 (H) 09/27/2024  PLT 211 09/27/2024    Lab Results  Component  Value Date   CREATININE 1.22 (H) 10/03/2024   BUN 16 10/03/2024   NA 139 10/03/2024   K 4.5 10/03/2024   CL 104 10/03/2024   CO2 27 10/03/2024   No results found for: ALT, AST, GGT, ALKPHOS, BILITOT  No results found for: CHOL, HDL, LDLCALC, LDLDIRECT, TRIG, CHOLHDL No results found for: LABRPR, RPRTITER No results found for: HIV1RNAQUANT, HIV1RNAVL, CD4TABS   Microbiology No results found for this or any previous visit.  Pathology  7/29 FINAL MICROSCOPIC DIAGNOSIS:  - No malignant cells identified  - Benign bronchial cells   FINAL MICROSCOPIC DIAGNOSIS:  A. LUNG, RML, FINE NEEDLE ASPIRATION:  - Negative for malignancy.  - Filamentous bacteria, see comment.   Comment:  AFB stain highlights filamentous bacteria. 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 A GMS stain was attempted and  repeated but controls did not work and so are non-contributory. A HMB-45  stain was performed to evaluate a focal area of cells with a clear  appearance and is negative. Stain controls worked appropriately.  This case underwent intradepartmental consultation and Dr. Jordan  concurs with the interpretation.  8/18 FINAL MICROSCOPIC DIAGNOSIS:  - Benign follicular nodule (Bethesda category II)   SPECIMEN ADEQUACY:  Satisfactory for evaluation   Imaging 07/06/24 MRI Brain IMPRESSION: 1. No acute intracranial abnormality. 2. Mild chronic white matter hyperintensity, most commonly due to chronic small vessel disease .  04/18/24 NM PET CT Cardiac perfusion IMPRESSION: 1.7 cm spiculated nodule in the right middle lobe, suspicious for bronchogenic carcinoma. Complete FDG PET-CT is recommended for further evaluation.  06/03/24 CT super D chest  IMPRESSION: 1. Spiculated 10 x 17 x 13 mm right middle lobe pulmonary nodule, highly concerning for bronchogenic  malignancy. Correlation with PET scan or tissue sampling recommended. 2. Heterogeneous nodular enlargement of the thyroid  with exophytic 3.5 cm retrosternal nodule as above. Recommend non-emergent thyroid  ultrasound. Reference: J Am Coll Radiol. 2015 Feb;12(2): 143-50 3. Aortic Atherosclerosis (ICD10-I70.0). Coronary artery atherosclerosis.  06/05/24 CXR IMPRESSION: 1. No pneumothorax post biopsy. 2. A 2.4 cm nodule in the right mid lung field with associated biopsy clip.  06/13/24 US  thyroid   IMPRESSION: 1. Heterogeneous, enlarged and multinodular thyroid  gland most consistent with multinodular goiter. 2. Nodule # 3 is a 3.3 cm TI-RADS category 3 nodule in the right lower gland and meets criteria to consider fine-needle aspiration biopsy. 3. No other nodules meet criteria for biopsy or imaging surveillance.   8/18 IMPRESSION: Successful ultrasound guided FNA biopsy of a 3.3 cm RIGHT inferior TR-3 thyroid  nodule  9/3 CT Chest  IMPRESSION: 1. The spiculated lesion in the right middle lobe adjacent to 2 fiducials measures approximately 17 x 10 mm. This previously measured 17 x 12 mm. 2. New smoothly marginated and well circumscribed cavitary lesion located just superior and medial to the spiculated lesion. This is just under the minor fissure and was not present on the prior study. It does measure fluid attenuation and could be a liquified hematoma related to prior biopsy. A small abscess is also possible but I do not see any surrounding inflammatory changes. Recommend correlation with clinical findings and close surveillance. 3. New 3 mm nodule in the right lower lobe. 4. Stable 5 mm ground-glass nodules in the left lower lobe, right upper lobe and right middle lobe. 5. Stable numerous scattered calcified granulomas. 6. Stable substernal thyroid   goiter with multiple thyroid  nodules. These have been previously biopsied. 7. Stable three-vessel coronary artery  calcifications. 8. Aortic atherosclerosis.   Aortic Atherosclerosis (ICD10-I70.0).   11/21 CT chest  IMPRESSION: 1. Stable 1.7 x 1 cm spiculated right middle lobe nodule adjacent to fiducials, unchanged from prior studies, with no new or progressive thoracic lymphadenopathy or other suspicious lesions. 2. Partially resolved prior right middle lobe fluid-filled cavity with interval shrinking to an 8 mm nodule with pleural stranding, likely postprocedural change; attention on follow-up recommended. . 3. Stable bilateral 6 mm ground-glass nodules and a stable 6 mm subsolid right upper lobe nodule without interval growth; recommend CT at 612 months to confirm persistence, then periodic surveillance per subsolid nodule guidelines. 4. Mild cardiomegaly with unchanged small chronic pericardial effusion and coronary and aortic atherosclerosis. 5. Chronic findings discussed above.  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 Plan to complete for at least 6 months if creatinine allows.  - Continue to hydrate  - Labs today including immunoglobulins - Fu in 2-3 weeks  - Fu with Pulmonary. Plan to repeat CT chest in approx 3 months to fu on pulmonary nodule, and possibly bronchoscopy thereafter if no improvement  # AKI  - in the setting of Bactrim  use, using lisinopril as needed only  - BMP today  - Hydration as above  - She has been referred to Nephrology   # Hypertension  - uses lisinopril as needed  - fu with PCP  # Type 2 DM - Fu with PCP  I personally spent a total of 30 minutes  in the care of the patient today including preparing to see the patient, getting/reviewing separately obtained history, performing a medically appropriate exam/evaluation, counseling and educating, placing orders, documenting clinical information in the EHR, independently interpreting results, and communicating results.   Annalee Joseph, MD [facility] 11/05/2024, 9:04 AM  "

## 2024-11-07 LAB — BASIC METABOLIC PANEL WITH GFR
BUN/Creatinine Ratio: 13 (calc) (ref 6–22)
BUN: 15 mg/dL (ref 7–25)
CO2: 28 mmol/L (ref 20–32)
Calcium: 10 mg/dL (ref 8.6–10.4)
Chloride: 106 mmol/L (ref 98–110)
Creat: 1.14 mg/dL — ABNORMAL HIGH (ref 0.60–0.95)
Glucose, Bld: 101 mg/dL — ABNORMAL HIGH (ref 65–99)
Potassium: 4.6 mmol/L (ref 3.5–5.3)
Sodium: 141 mmol/L (ref 135–146)
eGFR: 49 mL/min/1.73m2 — ABNORMAL LOW

## 2024-11-07 LAB — CBC
HCT: 41.4 % (ref 35.9–46.0)
Hemoglobin: 13.3 g/dL (ref 11.7–15.5)
MCH: 32.2 pg (ref 27.0–33.0)
MCHC: 32.1 g/dL (ref 31.6–35.4)
MCV: 100.2 fL (ref 81.4–101.7)
MPV: 9.8 fL (ref 7.5–12.5)
Platelets: 204 Thousand/uL (ref 140–400)
RBC: 4.13 Million/uL (ref 3.80–5.10)
RDW: 11.3 % (ref 11.0–15.0)
WBC: 4.5 Thousand/uL (ref 3.8–10.8)

## 2024-11-07 LAB — IGG, IGA, IGM
IgG (Immunoglobin G), Serum: 576 mg/dL — ABNORMAL LOW (ref 600–1540)
IgM, Serum: 21 mg/dL — ABNORMAL LOW (ref 50–300)
Immunoglobulin A: 84 mg/dL (ref 70–320)

## 2024-11-07 LAB — IGE: IgE (Immunoglobulin E), Serum: 4 kU/L

## 2024-11-09 ENCOUNTER — Other Ambulatory Visit: Payer: Self-pay | Admitting: Infectious Diseases

## 2024-11-09 ENCOUNTER — Ambulatory Visit: Payer: Self-pay | Admitting: Infectious Diseases

## 2024-11-09 DIAGNOSIS — D849 Immunodeficiency, unspecified: Secondary | ICD-10-CM

## 2024-11-12 ENCOUNTER — Encounter: Payer: Self-pay | Admitting: Acute Care

## 2024-11-12 ENCOUNTER — Other Ambulatory Visit: Payer: Self-pay

## 2024-11-12 ENCOUNTER — Other Ambulatory Visit

## 2024-11-12 ENCOUNTER — Ambulatory Visit: Admitting: Acute Care

## 2024-11-12 VITALS — BP 138/66 | HR 77 | Temp 98.7°F | Ht 67.0 in | Wt 154.6 lb

## 2024-11-12 DIAGNOSIS — A439 Nocardiosis, unspecified: Secondary | ICD-10-CM

## 2024-11-12 DIAGNOSIS — A430 Pulmonary nocardiosis: Secondary | ICD-10-CM

## 2024-11-12 DIAGNOSIS — R9389 Abnormal findings on diagnostic imaging of other specified body structures: Secondary | ICD-10-CM

## 2024-11-12 DIAGNOSIS — R0609 Other forms of dyspnea: Secondary | ICD-10-CM | POA: Diagnosis not present

## 2024-11-12 DIAGNOSIS — R942 Abnormal results of pulmonary function studies: Secondary | ICD-10-CM

## 2024-11-12 DIAGNOSIS — R911 Solitary pulmonary nodule: Secondary | ICD-10-CM

## 2024-11-12 DIAGNOSIS — R7689 Other specified abnormal immunological findings in serum: Secondary | ICD-10-CM | POA: Diagnosis not present

## 2024-11-12 MED ORDER — ALBUTEROL SULFATE HFA 108 (90 BASE) MCG/ACT IN AERS: 2.0000 | INHALATION_SPRAY | Freq: Four times a day (QID) | RESPIRATORY_TRACT | 2 refills | Status: AC | PRN

## 2024-11-12 NOTE — Progress Notes (Signed)
 "  History of Present Illness Lisa Bradshaw is a 79 y.o. female never smoker referred for lung nodule consult after incidental finding of a 1.7 cm spiculated pulmonary nodule noted on a cardiac PET scan. She will be followed by Dr. Shelah.   PMH includes Aortic stenosis, Pericardial effusion, DM, HLD, HTN, Breast ca s/p mastectomy.   Synopsis. Pt. Referred for abnormal finding of a 1.7 cm spiculated pulmonary nodule noted on cardiac CT 6/11/20205.This was followed by Super D CT Chest 06/03/2024. This showed by a super D CT chest which showed Spiculated 10 x 17 x 13 mm right middle lobe pulmonary nodule, highly concerning for bronchogenic malignancy. Correlation with PET scan or biopsy was recommended. Pt. underwent navigational bronchoscopy with biopsies on 06/05/2024.  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  Patient was referred to infectious disease, and has subsequently been started on Bactrim  high doses.  Goal is to reach 6 months of treatment.  This will be up 12/2024.Patient has had a slight bump in her creatinine and a decrease in her GFR while on treatment.  In order to compensate for this she has been taken off of her diuretic, and has subsequently developed lower extremity edema and some truncal edema.  She has been referred to nephrology by her primary care physician.   There was also notation of an abnormal thyroid .US  thyroid  on the super D CT 05/27/2024.  It showed a heterogeneous, enlarged and multinodular thyroid  gland most consistent with multinodular goiter. FNA biopsy was recommended. This was done 06/25/2024, and biopsy was negative for malignancy, positive for Benign follicular nodule (Bethesda category II) .     Lisa Bradshaw had been that after treatment with Bactrim  imaging would reveal a treatment response.  CT Chest imaging did not show significant response. She was seen by ID on 11/05/2024  .  Plan after that visit was to continue Bactrim  double strength 3 times daily daily, hydration was encouraged, she was referred to immunology as immunoglobulin testing indicated low IgM, IgG.  IgA was low normal at 84.     11/12/2024 Discussed the use of AI scribe software for clinical note transcription with the patient, who gave verbal consent to proceed.  History of Present Illness Lisa Bradshaw is an 79 year old female who presents for evaluation of her pulmonary function testing to evaluate dyspnea and to discuss recent immunoglobulin testing results.  We have reviewed her pulmonary function tests.  They indicate mild/moderate obstructive disease. Diffusion capacity is normal. She is a never smoker so I will check alpha-1.  She has a significant bronchodilator response.  Plan will be to prescribe an an albuterol  inhaler for use with shortness of breath or wheezing.  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  ID continues to keep close monitoring of her renal function.  She has been referred to nephrology for close monitoring of her kidneys while undergoing Bactrim  treatment.  An appointment has been made.   She has been referred to an immunologist due to low levels of immunoglobulins, specifically IgG, IgA, and IgM.  She experiences frequent upper respiratory infections and has a history of bronchiectasis.  I believe immunology referral is a good option to determine if she is a candidate for any infusions to supplement her immune deficiencies.  It will be interesting  to see if supplementation increases response to medications and if we we will see decrease in the size of pulmonary nodule of concern.  This referral still shows pending in the computer and therefore an appointment has not been secured.  Patient is very good about following up once she knows the number she can call to get the  appointment.  Plan is for a follow-up CT chest in February 2026 to reevaluate the pulmonary nodule.  At this point in time she will have been on Bactrim  DS for 6 months.  If at that time we do not see treatment response to antibiotic therapy we will need to determine need for repeat sputum cultures versus repeat bronchoscopy with biopsies for confirmation of nocardia.  Patient is in agreement with this plan.       Test Results:           Cytology 06/05/2024 A. LUNG, RML, FINE NEEDLE ASPIRATION:  - Negative for malignancy.  - Filamentous bacteria, see comment.   Comment:  AFB stain highlights filamentous bacteria. 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 A GMS stain was attempted and  repeated but controls did not work and so are non-contributory. A HMB-45  stain was performed to evaluate a focal area of cells with a clear  appearance and is negative. Stain controls worked appropriately.  This case underwent intradepartmental consultation and Dr. Jordan concurs with the interpretation.      B. LUNG, RML, LAVAGE:    FINAL MICROSCOPIC DIAGNOSIS:  - No malignant cells identified  - Benign bronchial cells      Imaging 07/06/24 MRI Brain IMPRESSION: 1. No acute intracranial abnormality. 2. Mild chronic white matter hyperintensity, most commonly due to chronic small vessel disease .   04/18/24 NM PET CT Cardiac perfusion IMPRESSION: 1.7 cm spiculated nodule in the right middle lobe, suspicious for bronchogenic carcinoma. Complete FDG PET-CT is recommended for further evaluation.   06/03/24 CT super D chest  IMPRESSION: 1. Spiculated 10 x 17 x 13 mm right middle lobe pulmonary nodule, highly concerning for bronchogenic malignancy. Correlation with PET scan or tissue sampling recommended. 2. Heterogeneous nodular enlargement of the thyroid  with exophytic 3.5 cm  retrosternal nodule as above. Recommend non-emergent thyroid  ultrasound. Reference: J Am Coll Radiol. 2015 Feb;12(2): 143-50 3. Aortic Atherosclerosis (ICD10-I70.0). Coronary artery atherosclerosis.   06/05/24 CXR IMPRESSION: 1. No pneumothorax post biopsy. 2. A 2.4 cm nodule in the right mid lung field with associated biopsy clip.   06/13/24 US  thyroid   IMPRESSION: 1. Heterogeneous, enlarged and multinodular thyroid  gland most consistent with multinodular goiter. 2. Nodule # 3 is a 3.3 cm TI-RADS category 3 nodule in the right lower gland and meets criteria to consider fine-needle aspiration biopsy. 3. No other nodules meet criteria for biopsy or imaging surveillance.   8/18 IMPRESSION: Successful ultrasound guided FNA biopsy of a 3.3 cm RIGHT inferior TR-3 thyroid  nodule   9/3 CT Chest  IMPRESSION: 1. The spiculated lesion in the right middle lobe adjacent to 2 fiducials measures approximately 17 x 10 mm. This previously measured 17 x 12 mm. 2. New smoothly marginated and well circumscribed cavitary lesion located just superior and medial to the spiculated lesion. This is just under the minor fissure and was not present on the prior study. It does measure fluid attenuation and could be a liquified hematoma related to prior biopsy. A small abscess is also possible but I do not  see any surrounding inflammatory changes. Recommend correlation with clinical findings and close surveillance. 3. New 3 mm nodule in the right lower lobe. 4. Stable 5 mm ground-glass nodules in the left lower lobe, right upper lobe and right middle lobe. 5. Stable numerous scattered calcified granulomas. 6. 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 These have been previously biopsied. 7. Stable three-vessel coronary artery calcifications. 8. Aortic atherosclerosis.   Aortic Atherosclerosis (ICD10-I70.0).   11/21 CT chest  IMPRESSION: 1. Stable 1.7 x 1 cm spiculated right  middle lobe nodule adjacent to fiducials, unchanged from prior studies, with no new or progressive thoracic lymphadenopathy or other suspicious lesions. 2. Partially resolved prior right middle lobe fluid-filled cavity with interval shrinking to an 8 mm nodule with pleural stranding, likely postprocedural change; attention on follow-up recommended. . 3. Stable bilateral 6 mm ground-glass nodules and a stable 6 mm subsolid right upper lobe nodule without interval growth; recommend CT at 612 months to confirm persistence, then periodic surveillance per subsolid nodule guidelines. 4. Mild cardiomegaly with unchanged small chronic pericardial effusion and coronary and aortic atherosclerosis. 5. Chronic findings discussed above.      Latest Ref Rng & Units 11/05/2024    9:07 AM 09/27/2024    2:42 PM 08/02/2024    9:13 AM  CBC  WBC 3.8 - 10.8 Thousand/uL 4.5  5.0  4.1   Hemoglobin 11.7 - 15.5 g/dL 86.6  87.2  86.7   Hematocrit 35.9 - 46.0 % 41.4  38.5  40.6   Platelets 140 - 400 Thousand/uL 204  211  206        Latest Ref Rng & Units 11/05/2024    9:07 AM 10/03/2024    8:54 AM 09/27/2024    2:42 PM  BMP  Glucose 65 - 99 mg/dL 898  896  79   BUN 7 - 25 mg/dL 15  16  16    Creatinine 0.60 - 0.95 mg/dL 8.85  8.77  8.78   BUN/Creat Ratio 6 - 22 (calc) 13  13  13    Sodium 135 - 146 mmol/L 141  139  137   Potassium 3.5 - 5.3 mmol/L 4.6  4.5  4.4   Chloride 98 - 110 mmol/L 106  104  102   CO2 20 - 32 mmol/L 28  27  28    Calcium 8.6 - 10.4 mg/dL 89.9  9.9  89.7     BNP No results found for: BNP  ProBNP No results found for: PROBNP  PFT    Component Value Date/Time   FEV1PRE 1.39 10/25/2024 1437   FEV1POST 1.67 10/25/2024 1437   FVCPRE 2.00 10/25/2024 1437   FVCPOST 2.24 10/25/2024 1437   TLC 6.04 10/25/2024 1437   DLCOUNC 21.38 10/25/2024 1437   PREFEV1FVCRT 70 10/25/2024 1437   PSTFEV1FVCRT 75 10/25/2024 1437    No results found.   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 > 20 yr.   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 breast [facility])    Mass of finger of right hand 02/06/2014   thumb   Mild concentric left ventricular hypertrophy    Mitral valve regurgitation    Non-insulin dependent type 2 diabetes mellitus (HCC)    Osteoarthritis of hand    Pericardial effusion (noninflammatory) 11/08/2000   history of  - no longer sees cardiologist   Seasonal allergies      Social History[1]  Ms.Goshert reports that she has never smoked. She has been exposed to tobacco smoke. She has never used smokeless tobacco. She reports that she does not drink alcohol and does not use drugs.  Tobacco Cessation: Counseling given: Not Answered Never smoker  Past surgical hx, Family hx, Social hx all reviewed.  Current Outpatient Medications on File Prior to Visit  Medication Sig   aspirin 81 MG tablet Take 81 mg by mouth daily.   ezetimibe-simvastatin (VYTORIN) 10-40 MG per tablet Take 1 tablet by mouth daily.   lisinopril (ZESTRIL) 5 MG tablet Take 5 mg by mouth daily.   loratadine (CLARITIN) 10 MG tablet Take 10 mg by mouth daily as needed for allergies.    Multiple Vitamin (MULTIVITAMIN) tablet Take 1 tablet by mouth daily.   ONETOUCH VERIO test strip 1 each 2 (two) times daily.   sulfamethoxazole -trimethoprim  (BACTRIM  DS) 800-160 MG tablet Take 1 tablet by mouth in the morning, at noon, and at bedtime.   No current facility-administered medications on  file prior to visit.     Allergies[2]  Review Of Systems:  Constitutional:   No  weight loss, night sweats,  Fevers, chills, +fatigue, or  lassitude.  HEENT:   No headaches,  Difficulty swallowing,  Tooth/dental problems, or  Sore throat,                No sneezing, itching, ear ache, nasal congestion, post nasal drip,   CV:  No chest pain,  Orthopnea, PND, swelling in lower extremities, anasarca, dizziness, palpitations, syncope.   GI  No heartburn, indigestion, abdominal pain, +nausea, vomiting, diarrhea, change in bowel habits, +loss of appetite, bloody stools.   Resp: + shortness of breath with exertion less at rest.  No excess mucus, no productive cough,  No non-productive cough,  No coughing up of blood.  No change in color of mucus.  No wheezing.  No chest wall deformity  Skin: no rash or lesions.  GU: no dysuria, change in color of urine, no urgency or frequency.  No flank pain, no hematuria   MS:  + joint pain/ swelling.  No decreased range of motion.  No back pain.  Psych:  No change in mood or affect. No depression or anxiety.  No memory loss.   Vital Signs BP 138/66   Pulse 77   Temp 98.7 F (37.1 C) (Oral)   Ht 5' 7 (1.702 m)   Wt 154 lb 9.6 oz (70.1 kg)   SpO2 98%   BMI 24.21 kg/m    Physical Exam: Physical Exam GENERAL: No distress, alert and oriented times 3. EARS NOSE THROAT: No sinus tenderness, tympanic membranes clear, pale nasal mucosa, no oral exudate, no post nasal drip, no lymphadenopathy. CHEST: No wheeze, rales, dullness, no accessory muscle use, no nasal flaring, no sternal retractions. CARDIAC: S1, S2, regular rate and rhythm, no murmur. ABDOMINAL: Soft, non tender. ND, BS present, EXTREMITIES: No  clubbing, cyanosis, edema. No obvious deformities NEUROLOGICAL: Normal strength. Alert and oriented x 3, MAE x 4 SKIN: No rashes, warm and dry. No obvious skin lesions PSYCHIATRIC: Normal mood and behavior.   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 - Prescribed albuterol  inhaler for use as needed for shortness of breath or wheezing. - Provided instructions for inhaler use. - Offered spacer if needed for inhaler use. 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  Potential need for biopsy if CT scan in February 2026 shows no improvement. - Scheduled repeat CT scan in February 2026 to assess infection status. - Will consider biopsy or sputum culture if CT scan shows no improvement.  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 - Referred to immunologist for evaluation and management. - Ordered alpha-1 antitrypsin test to rule out COPD. - Question if immunology will recommend infusions to supplement low immunoglobulin levels  Renal function impacted by Bactrim  treatment. Plan Bactrim  dose  has been decreased Bactrim  treatment to be completed February 2026 Patient has appointment with nephrology to closely evaluate renal function. Close monitoring per ID of BUN/creatinine   I spent 30 minutes dedicated to the care of this patient on the date of this encounter to include pre-visit review of records, face-to-face time with the patient discussing conditions above, post visit ordering of testing, clinical documentation with the electronic health record, making appropriate referrals as documented, and communicating necessary information to the patient's healthcare team.   Lauraine JULIANNA Lites, NP 11/12/2024  11:54 AM             [1]  Social History Tobacco Use   Smoking status: Never    Passive exposure: Past   Smokeless  tobacco: Never  Substance Use Topics   Alcohol use: No   Drug use: No  [2] No Known Allergies  "

## 2024-11-12 NOTE — Patient Instructions (Signed)
 It is good to see you today. We have reviewed your pulmonary function test. These were read as positive for some mild pulmonary obstruction. You have a great response to albuterol . I have sent in a prescription for albuterol  inhaler. Take 1 to 2 puffs as needed for shortness of breath or wheezing. This should help you when you become short of breath. I will have my nurse review how to use the albuterol  inhaler with you today. Please let us  know if you feel like you need a spacer to help with the timing of administration of the medication. Also remember YouTube has some great videos for instruction of use of inhalers. I am encouraged that you have been sent to an immunologist as they may be able to look deeper into the reason that you have these frequent respiratory infections. We are unable to see who you are referred to as it is still processing in the computer. I will draw an alpha 1 level on you today just to ensure that this is not the reason for your mild pulmonary obstruction as you are never smoker. We can review these results when I see you in February. Follow-up February 2026 after the CT scan of the chest to reevaluate the nodule that is being treated by ID. Based on results of the CT scan we can discuss whether or not we need to rebiopsy the nodule. Please call if you need us  for anything at all sooner. Please contact office for sooner follow up if symptoms do not improve or worsen or seek emergency care

## 2024-11-13 ENCOUNTER — Ambulatory Visit: Payer: Self-pay | Admitting: Infectious Diseases

## 2024-11-13 LAB — BASIC METABOLIC PANEL WITH GFR
BUN/Creatinine Ratio: 14 (calc) (ref 6–22)
BUN: 16 mg/dL (ref 7–25)
CO2: 26 mmol/L (ref 20–32)
Calcium: 10.2 mg/dL (ref 8.6–10.4)
Chloride: 104 mmol/L (ref 98–110)
Creat: 1.14 mg/dL — ABNORMAL HIGH (ref 0.60–0.95)
Glucose, Bld: 87 mg/dL (ref 65–99)
Potassium: 4.4 mmol/L (ref 3.5–5.3)
Sodium: 141 mmol/L (ref 135–146)
eGFR: 49 mL/min/1.73m2 — ABNORMAL LOW

## 2024-11-15 LAB — ALPHA-1 ANTITRYPSIN PHENOTYPE: A-1 Antitrypsin, Ser: 119 mg/dL (ref 83–199)

## 2024-11-21 ENCOUNTER — Ambulatory Visit: Payer: Self-pay | Admitting: Infectious Diseases

## 2024-11-21 ENCOUNTER — Other Ambulatory Visit: Payer: Self-pay

## 2024-11-21 VITALS — BP 155/77 | HR 70 | Temp 98.5°F | Wt 154.0 lb

## 2024-11-21 DIAGNOSIS — Z79899 Other long term (current) drug therapy: Secondary | ICD-10-CM | POA: Diagnosis not present

## 2024-11-21 DIAGNOSIS — D849 Immunodeficiency, unspecified: Secondary | ICD-10-CM | POA: Insufficient documentation

## 2024-11-21 DIAGNOSIS — E119 Type 2 diabetes mellitus without complications: Secondary | ICD-10-CM | POA: Diagnosis not present

## 2024-11-21 DIAGNOSIS — R911 Solitary pulmonary nodule: Secondary | ICD-10-CM

## 2024-11-21 DIAGNOSIS — D809 Immunodeficiency with predominantly antibody defects, unspecified: Secondary | ICD-10-CM | POA: Diagnosis not present

## 2024-11-21 DIAGNOSIS — N179 Acute kidney failure, unspecified: Secondary | ICD-10-CM

## 2024-11-21 DIAGNOSIS — Z5181 Encounter for therapeutic drug level monitoring: Secondary | ICD-10-CM

## 2024-11-21 DIAGNOSIS — I10 Essential (primary) hypertension: Secondary | ICD-10-CM

## 2024-11-21 DIAGNOSIS — A439 Nocardiosis, unspecified: Secondary | ICD-10-CM

## 2024-11-21 NOTE — Progress Notes (Signed)
 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 8 g 2   aspirin 81 MG tablet Take 81 mg by mouth daily.     ezetimibe-simvastatin (VYTORIN) 10-40 MG per tablet Take 1 tablet by mouth daily.     lisinopril (ZESTRIL) 5 MG tablet Take 5 mg by mouth daily.     loratadine (CLARITIN) 10 MG tablet Take 10 mg by mouth daily as needed for allergies.      Multiple Vitamin (MULTIVITAMIN) tablet Take 1 tablet by mouth daily.     ONETOUCH VERIO test strip 1 each 2 (two) times daily.     sulfamethoxazole -trimethoprim  (BACTRIM  DS) 800-160 MG tablet Take 1 tablet by mouth in the morning, at noon, and at bedtime. 90 tablet 0   No current facility-administered medications on file prior to visit.    Subjective: Consented to the use of AI scribe software.  79 Y O female with prior h/o Aortic stenosis, Pericardial effusion, DM, HLD, HTN, Breast ca s/p mastectomy who is referred from Pulmonary in the setting of recent bronchoscopy with path findings that could be nocardia.   She initially had a NM PET scan by Dr Jeffrie for evaluation of chest pain. Findings showed 1.7 cm spiculated nodule in the right middle lobe, suspicious for bronchogenic carcinoma.   This was followed by CT Chest which showed  Spiculated 10 x 17 x 13 mm right middle lobe  pulmonary nodule, highly concerning for bronchogenic malignancy.  Heterogeneous nodular enlargement of the thyroid  with exophytic 3.5 cm retrosternal nodule as above. Recommend non-emergent thyroid  ultrasound.  She was then referred to Pulmonary and underwent  Flexible video fiberoptic bronchoscopy and biopsies. Patient tells me some of the specimen got lost. Do not see any cultures were sent.   She denies any respiratory symptoms as such before the bronchoscopy like cough, SOB, fevers and chills. However, after the bronchoscopy, she started having cough and increased wheezing. She completed a course of Zpack and prednisone  taper with significant improvement.   She had a fu with Pulmonary on 8/4 where she was started on Bactrim  DS po bid for 21 days along with mucinex, flutter valve.   She also had an US  guided biopsy for the thyroid  nodule on 8/18. Path with benign follicular nodule.   Reports she is a pretty functional and highly active 79 year old woman and involved a lot of activities including volunteering. Denies any changes in appetite but reports weight down from 180 lbs to 147 lbs, with intentional dietary changes.   She experienced an episode of nausea and vomiting this Monday, accompanied by profuse sweating but no diarrhea/abdominal pain. No recent headaches, blurry vision, rashes, GU symptoms or neck pain, joint pain, although she has arthritis.   Denies smoking, alcohol, or recreational drug use. She lives alone and has two adult children.  She denies being sexually active for many years.   Denies known h/o of any immunocompromising conditions or diseases or prolonged steroids.  9/25  Reports compliance with p.o. Bactrim  2 tablets twice daily.  However she feels sick and nauseous.  She is trying to keep herself hydrated as much as possible.  She also had low blood pressure as well as fluctuation in her blood glucose for which she is following PCP.  She is unable to expectorate any  sputum.  She does not feel any different after taking antibiotics.   She has several questions regarding if she truly has nocardia pneumonia or something else.  If she has nocardia pneumonia, how could she have guarded. [place], which she speculates could have exposed her to the infection.  Seen by pulmonary NP 9/17 and plan to repeat CT chest in 3 months and consider repeat bronchoscopy if cultures are negative.   She talked to her granddaughter who is a respiratory therapist and recommended to get an expert opinion at [facility].  11/20 She was seen by Dr. Norman on 10/15 at [facility] for second opinion who recommended to continue Bactrim  for probable 6 months.  Per their notes, patient wished to follow-up with them but today patient states, she wants to follow-up with us  as ?  did not get refill of meds and no changes in treatment plan was made.   She reports compliance with p.o. Bactrim  with no missed doses, however feels fatigued, nauseous.  She is drinking 60 to 70 ounce of water every day and feels like gaining weight, denies any vomiting, abdominal pain or diarrhea. No significant change in breathing but mild cough.  She reports BP running high and following with PCP/cardiology. Reports receiving flu vaccine.   12/29 Decreased Bactrim  to 1 tablet p.o. 3 times daily on 11/24 due to elevated creatinine.  Seen by Dr. Luiz on 12/5, continued on Bactrim  p.o. 3 times daily. No labs were done.  Seen by pulmonary on 12/8, CT chest findings were reviewed.  Discussed case with Dr. Shelah and plan to follow-up another CT chest in 3 months after approximately 6 months of Bactrim  treatment. PFT has been completed and results are pending.  She reports still nauseous but better than with 4 tabs daily dosing and new joint pains in her knee and hip with worsening mobility, including near falls and difficulty climbing stairs. She notes new  confusion and memory problems, saying she feels she is not me anymore. She is worried that the pulmonary nodule has not decreased in size after about four months of therapy and questions whether the dose is effective, and she is concerned that Bactrim  is harming her kidneys. She has taken lisinopril as needed for her BP.  1/14 Reports compliance with p.o. Bactrim  with no missed doses or concerns .  She is continuing to drink more water. Saw pulmonary 1/5. PFTs done.  She has an appointment with nephrology tomorrow and immunology on February 10.  She gets SOB when walking up hill or climbing stairs but reports does not walk much.  Mild dry cough.  No other concerns.   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 > 20 yr.   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 breast [facility])    Mass of finger of right hand 02/06/2014   thumb   Mild concentric left ventricular hypertrophy    Mitral valve regurgitation    Non-insulin dependent type 2 diabetes mellitus (HCC)    Osteoarthritis of hand    Pericardial effusion (noninflammatory) 11/08/2000   history of  - no longer sees cardiologist   Seasonal allergies    Past Surgical History:  Procedure Laterality Date   ABDOMINAL HYSTERECTOMY  1992   complete   CESAREAN SECTION  1969   EXCISION METACARPAL MASS Right 02/25/2014   Procedure: EXCISION MASS RIGHT THUMB  ;   Surgeon: Franky JONELLE Curia, MD;  Location: [facility];  Service: Orthopedics;  Laterality: Right;   FOOT NEUROMA SURGERY Left 2006   MASTECTOMY Left 04/03/2004   TOTAL MASTECTOMY Right 09/22/2006   with ax. node bx.   TRANSESOPHAGEAL ECHOCARDIOGRAM  08/23/2001   VIDEO BRONCHOSCOPY WITH ENDOBRONCHIAL NAVIGATION Right 06/05/2024   Procedure: VIDEO BRONCHOSCOPY WITH ENDOBRONCHIAL NAVIGATION;  Surgeon: Shelah Lamar RAMAN, MD;  Location[facility];  Service: Pulmonary;  Laterality: Right;    Social History   Tobacco Use   Smoking status: Never    Passive exposure: Past   Smokeless tobacco: Never  Substance Use Topics   Alcohol use: No   Drug use: No    Family History  Problem Relation Age of Onset   Cancer - Other Mother    Cancer - Other Father    Stroke Father    CAD Father     No Known Allergies  Health Maintenance  Topic Date Due   Diabetic kidney evaluation - Urine ACR  Never done   Mammogram  05/05/2007   Bone Density Scan  Never done   Medicare Annual Wellness (AWV)  12/09/2023   COVID-19 Vaccine (1) 11/28/2024 (Originally 08/12/1946)   Influenza Vaccine  02/05/2025 (Originally 06/08/2024)   Diabetic kidney evaluation - eGFR measurement  11/12/2025   DTaP/Tdap/Td (3 - Td or Tdap) 07/25/2030   Pneumococcal Vaccine: 50+ Years  Completed   Zoster Vaccines- Shingrix  Completed   Meningococcal B Vaccine  Aged Out   Colonoscopy  Discontinued    Objective: BP (!) 155/77   Pulse 70   Temp 98.5 F (36.9 C) (Oral)   Wt 154 lb (69.9 kg)   SpO2 96%   BMI 24.12 kg/m    Physical Exam Constitutional:      Appearance: Normal appearance.  HENT:     Head: Normocephalic and atraumatic.      Mouth: Mucous membranes are moist.  Eyes:    Conjunctiva/sclera: Conjunctivae normal.     Pupils: Pupils are equal, round, and b/l symmetrical    Cardiovascular:     Rate and Rhythm: Normal rate     Heart sounds:  Pulmonary:     Effort: Pulmonary effort is normal.      Breath sounds:  Abdominal:     General: Non distended     Palpations:  Musculoskeletal:        General: Ambulatory  Skin:    General: Skin is warm and dry.     Comments:  Neurological:     General: grossly non focal     Mental Status: awake, alert  Psychiatric:        Mood and Affect: Mood normal.   Lab Results Lab Results  Component Value Date   WBC 4.5 11/05/2024   HGB 13.3 11/05/2024   HCT 41.4 11/05/2024   MCV 100.2 11/05/2024   PLT 204 11/05/2024    Lab Results  Component Value Date   CREATININE 1.14 (H) 11/12/2024   BUN 16 11/12/2024   NA 141 11/12/2024   K 4.4 11/12/2024   CL 104 11/12/2024   CO2 26 11/12/2024   No results found for: ALT, AST, GGT, ALKPHOS, BILITOT  No results found for: CHOL, HDL, LDLCALC, LDLDIRECT, TRIG, CHOLHDL No results found for: LABRPR, RPRTITER No results found for: HIV1RNAQUANT, HIV1RNAVL, CD4TABS   Microbiology No results found for this or any previous visit.  Pathology  7/29 FINAL MICROSCOPIC DIAGNOSIS:  - No malignant cells identified  - Benign bronchial cells   FINAL MICROSCOPIC DIAGNOSIS:  A. LUNG, RML, FINE NEEDLE ASPIRATION:  - Negative for malignancy.  - Filamentous bacteria, see comment.   Comment:  AFB stain highlights filamentous bacteria. 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 A GMS stain was attempted and  repeated but controls did not work and so are non-contributory. A HMB-45  stain was performed to evaluate a focal area of cells with a clear  appearance and is negative. Stain controls worked appropriately.  This case underwent intradepartmental consultation and Dr. Jordan  concurs with the interpretation.  8/18 FINAL MICROSCOPIC DIAGNOSIS:  - Benign follicular nodule (Bethesda category II)   SPECIMEN ADEQUACY:  Satisfactory for evaluation   Imaging 07/06/24 MRI  Brain IMPRESSION: 1. No acute intracranial abnormality. 2. Mild chronic white matter hyperintensity, most commonly due to chronic small vessel disease .  04/18/24 NM PET CT Cardiac perfusion IMPRESSION: 1.7 cm spiculated nodule in the right middle lobe, suspicious for bronchogenic carcinoma. Complete FDG PET-CT is recommended for further evaluation.  06/03/24 CT super D chest  IMPRESSION: 1. Spiculated 10 x 17 x 13 mm right middle lobe pulmonary nodule, highly concerning for bronchogenic malignancy. Correlation with PET scan or tissue sampling recommended. 2. Heterogeneous nodular enlargement of the thyroid  with exophytic 3.5 cm retrosternal nodule as above. Recommend non-emergent thyroid  ultrasound. Reference: J Am Coll Radiol. 2015 Feb;12(2): 143-50 3. Aortic Atherosclerosis (ICD10-I70.0). Coronary artery atherosclerosis.  06/05/24 CXR IMPRESSION: 1. No pneumothorax post biopsy. 2. A 2.4 cm nodule in the right mid lung field with associated biopsy clip.  06/13/24 US  thyroid   IMPRESSION: 1. Heterogeneous, enlarged and multinodular thyroid  gland most consistent with multinodular goiter. 2. Nodule # 3 is a 3.3 cm TI-RADS category 3 nodule in the right lower gland and meets criteria to consider fine-needle aspiration biopsy. 3. No other nodules meet criteria for biopsy or imaging surveillance.   8/18 IMPRESSION: Successful ultrasound guided FNA biopsy of a 3.3 cm RIGHT inferior TR-3 thyroid  nodule  9/3 CT Chest  IMPRESSION: 1. The spiculated lesion in the right middle lobe adjacent to 2 fiducials measures approximately 17 x 10 mm. This previously measured 17 x 12 mm. 2. New smoothly marginated and well circumscribed cavitary lesion located just superior and medial to the spiculated lesion. This is  just under the minor fissure and was not present on the prior study. It does measure fluid attenuation and could be a liquified hematoma related to prior biopsy. A small  abscess is also possible but I do not see any surrounding inflammatory changes. Recommend correlation with clinical findings and close surveillance. 3. New 3 mm nodule in the right lower lobe. 4. Stable 5 mm ground-glass nodules in the left lower lobe, right upper lobe and right middle lobe. 5. Stable numerous scattered calcified granulomas. 6. 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 These have been previously biopsied. 7. Stable three-vessel coronary artery calcifications. 8. Aortic atherosclerosis.   Aortic Atherosclerosis (ICD10-I70.0).   11/21 CT chest  IMPRESSION: 1. Stable 1.7 x 1 cm spiculated right middle lobe nodule adjacent to fiducials, unchanged from prior studies, with no new or progressive thoracic lymphadenopathy or other suspicious lesions. 2. Partially resolved prior right middle lobe fluid-filled cavity with interval shrinking to an 8 mm nodule with pleural stranding, likely postprocedural change; attention on follow-up recommended. . 3. Stable bilateral 6 mm ground-glass nodules and a stable 6 mm subsolid right upper lobe nodule without interval growth; recommend CT at 612 months to confirm persistence, then periodic surveillance per subsolid nodule guidelines. 4. Mild cardiomegaly with unchanged small chronic pericardial effusion and coronary and aortic atherosclerosis. 5. Chronic findings discussed above.  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 Plan to complete for at least 6 months through end of Feb  if creatinine  allows.  - Plan to repeat CT chest once 6 months completed as planned by Pulmonary  - Continue to hydrate  - Labs today - Fu in 2-3 weeks  - Fu with Pulmonary - I have advised if repeat CT chest with no improvement of prior nodules.  I advised her to be seen at an academic center for higher level of care/expertise, she is agreeable  # AKI  - in the setting of Bactrim  use, using lisinopril as needed only  - BMP today  - Hydration as above  - Fu Nephrology tomorrow  # Hypogammaglobulinemia - fu with Immunology   # Hypertension  # Type 2 DM  - uses lisinopril as needed  - fu with PCP  I personally spent a total of 22 minutes in the care of the patient today including preparing to see the patient, getting/reviewing separately obtained history, performing a medically appropriate exam/evaluation, counseling and educating, placing orders, documenting clinical information in the EHR, independently interpreting results, communicating results, and coordinating care.    Annalee Joseph, MD [facility] 11/21/2024, 8:47 AM  "

## 2024-11-22 ENCOUNTER — Ambulatory Visit: Payer: Self-pay | Admitting: Infectious Diseases

## 2024-11-22 ENCOUNTER — Other Ambulatory Visit: Payer: Self-pay

## 2024-11-22 DIAGNOSIS — N1831 Chronic kidney disease, stage 3a: Secondary | ICD-10-CM

## 2024-11-22 LAB — BASIC METABOLIC PANEL WITH GFR
BUN/Creatinine Ratio: 13 (calc) (ref 6–22)
BUN: 14 mg/dL (ref 7–25)
CO2: 26 mmol/L (ref 20–32)
Calcium: 10.2 mg/dL (ref 8.6–10.4)
Chloride: 106 mmol/L (ref 98–110)
Creat: 1.08 mg/dL — ABNORMAL HIGH (ref 0.60–0.95)
Glucose, Bld: 98 mg/dL (ref 65–99)
Potassium: 4.4 mmol/L (ref 3.5–5.3)
Sodium: 142 mmol/L (ref 135–146)
eGFR: 52 mL/min/1.73m2 — ABNORMAL LOW

## 2024-11-22 NOTE — Progress Notes (Signed)
 Lisa Bradshaw                                          MRN: [id number]   11/22/2024   The [facility] Quality Team Specialist reviewed this patient medical record for the purposes of chart review for care gap closure. The following were reviewed: chart review for care gap closure-controlling blood pressure.    [facility] Quality Team

## 2024-11-23 ENCOUNTER — Ambulatory Visit
Admission: RE | Admit: 2024-11-23 | Discharge: 2024-11-23 | Disposition: A | Discharge status: Home / Self Care | Source: Ambulatory Visit

## 2024-11-23 DIAGNOSIS — N1831 Chronic kidney disease, stage 3a: Secondary | ICD-10-CM

## 2024-12-05 ENCOUNTER — Ambulatory Visit: Admitting: Internal Medicine

## 2024-12-13 ENCOUNTER — Ambulatory Visit: Admitting: Internal Medicine

## 2024-12-18 ENCOUNTER — Ambulatory Visit: Payer: Self-pay | Admitting: Allergy

## 2024-12-19 ENCOUNTER — Ambulatory Visit: Admitting: Internal Medicine

## 2024-12-31 ENCOUNTER — Ambulatory Visit (HOSPITAL_BASED_OUTPATIENT_CLINIC_OR_DEPARTMENT_OTHER)

## 2025-01-14 ENCOUNTER — Ambulatory Visit: Admitting: Acute Care

## 2025-01-28 ENCOUNTER — Other Ambulatory Visit (HOSPITAL_BASED_OUTPATIENT_CLINIC_OR_DEPARTMENT_OTHER)

## 2025-06-10 ENCOUNTER — Other Ambulatory Visit

## 2025-06-17 ENCOUNTER — Ambulatory Visit: Admitting: "Endocrinology
